# Patient Record
Sex: Female | Born: 1985 | Race: Black or African American | Hispanic: No | Marital: Single | Smoking: Current every day smoker
Health system: Southern US, Community
[De-identification: ages and names within clinical notes are randomized; demographics above are authoritative.]

## PROBLEM LIST (undated history)

## (undated) ENCOUNTER — Inpatient Hospital Stay (HOSPITAL_COMMUNITY): Payer: Self-pay

## (undated) DIAGNOSIS — F99 Mental disorder, not otherwise specified: Secondary | ICD-10-CM

## (undated) DIAGNOSIS — F32A Depression, unspecified: Secondary | ICD-10-CM

## (undated) DIAGNOSIS — F419 Anxiety disorder, unspecified: Secondary | ICD-10-CM

## (undated) DIAGNOSIS — Z3490 Encounter for supervision of normal pregnancy, unspecified, unspecified trimester: Secondary | ICD-10-CM

## (undated) DIAGNOSIS — R87629 Unspecified abnormal cytological findings in specimens from vagina: Secondary | ICD-10-CM

## (undated) DIAGNOSIS — F1910 Other psychoactive substance abuse, uncomplicated: Secondary | ICD-10-CM

## (undated) DIAGNOSIS — F329 Major depressive disorder, single episode, unspecified: Secondary | ICD-10-CM

## (undated) HISTORY — DX: Unspecified abnormal cytological findings in specimens from vagina: R87.629

## (undated) HISTORY — PX: NO PAST SURGERIES: SHX2092

---

## 2011-03-06 NOTE — L&D Delivery Note (Signed)
Delivery Note At 8:53 PM a viable female was delivered via  (Presentation: OA; LOT), compound hand.  APGAR: 9, 9; weight 4 lb 11.7 oz (2146 g).   Placenta status: Intact, Spontaneous, via Tomasa Blase.  Send to Pathology.  Cord: 3 vessels.    Anesthesia: Epidural  Episiotomy: none Lacerations: 1 st degree RT labia minora Suture Repair: 3.0 vicryl rapide Est. Blood Loss (mL): 250  Mom to postpartum.  Baby to nursery-stable.  Raelyn Mora, SNM 02/19/2012, 9:30 PM Supervised by: Dorathy Kinsman, CNM & Sid Falcon, CNM

## 2011-03-06 NOTE — L&D Delivery Note (Signed)
I was present for the delivery and agree with above. Sid Falcon, CNM present for repair.  [place], CNM 02/19/2012 10:14 PM

## 2011-08-11 ENCOUNTER — Encounter (HOSPITAL_COMMUNITY): Payer: Self-pay | Admitting: *Deleted

## 2011-08-11 ENCOUNTER — Emergency Department (HOSPITAL_COMMUNITY)
Admission: EM | Admit: 2011-08-11 | Discharge: 2011-08-11 | Disposition: A | Discharge status: Other Acute Inpatient Hospital | Payer: Self-pay | Attending: Emergency Medicine | Admitting: Emergency Medicine

## 2011-08-11 ENCOUNTER — Inpatient Hospital Stay (HOSPITAL_COMMUNITY)
Admission: RE | Admit: 2011-08-11 | Discharge: 2011-08-15 | DRG: 781 | Payer: Federal, State, Local not specified - Other | Source: Other Acute Inpatient Hospital | Attending: Psychiatry | Admitting: Psychiatry

## 2011-08-11 DIAGNOSIS — F17200 Nicotine dependence, unspecified, uncomplicated: Secondary | ICD-10-CM | POA: Insufficient documentation

## 2011-08-11 DIAGNOSIS — F1410 Cocaine abuse, uncomplicated: Secondary | ICD-10-CM | POA: Insufficient documentation

## 2011-08-11 DIAGNOSIS — F39 Unspecified mood [affective] disorder: Secondary | ICD-10-CM | POA: Diagnosis present

## 2011-08-11 DIAGNOSIS — O99340 Other mental disorders complicating pregnancy, unspecified trimester: Secondary | ICD-10-CM | POA: Diagnosis present

## 2011-08-11 DIAGNOSIS — O99891 Other specified diseases and conditions complicating pregnancy: Secondary | ICD-10-CM | POA: Insufficient documentation

## 2011-08-11 DIAGNOSIS — F1920 Other psychoactive substance dependence, uncomplicated: Principal | ICD-10-CM | POA: Diagnosis present

## 2011-08-11 DIAGNOSIS — F1010 Alcohol abuse, uncomplicated: Secondary | ICD-10-CM | POA: Insufficient documentation

## 2011-08-11 DIAGNOSIS — F1420 Cocaine dependence, uncomplicated: Secondary | ICD-10-CM | POA: Diagnosis present

## 2011-08-11 DIAGNOSIS — Z331 Pregnant state, incidental: Secondary | ICD-10-CM

## 2011-08-11 DIAGNOSIS — F1020 Alcohol dependence, uncomplicated: Secondary | ICD-10-CM | POA: Diagnosis present

## 2011-08-11 DIAGNOSIS — F1910 Other psychoactive substance abuse, uncomplicated: Secondary | ICD-10-CM

## 2011-08-11 DIAGNOSIS — O99320 Drug use complicating pregnancy, unspecified trimester: Secondary | ICD-10-CM | POA: Diagnosis present

## 2011-08-11 DIAGNOSIS — F4310 Post-traumatic stress disorder, unspecified: Secondary | ICD-10-CM | POA: Diagnosis not present

## 2011-08-11 LAB — DIFFERENTIAL
Basophils Absolute: 0 K/uL (ref 0.0–0.1)
Basophils Relative: 0 % (ref 0–1)
Eosinophils Absolute: 0.1 K/uL (ref 0.0–0.7)
Eosinophils Relative: 1 % (ref 0–5)
Lymphocytes Relative: 21 % (ref 12–46)
Lymphs Abs: 3.1 K/uL (ref 0.7–4.0)
Monocytes Absolute: 0.9 K/uL (ref 0.1–1.0)
Monocytes Relative: 6 % (ref 3–12)
Neutro Abs: 10.9 K/uL — ABNORMAL HIGH (ref 1.7–7.7)
Neutrophils Relative %: 73 % (ref 43–77)

## 2011-08-11 LAB — CBC
HCT: 37.3 % (ref 36.0–46.0)
Hemoglobin: 13.9 g/dL (ref 12.0–15.0)
MCH: 29.7 pg (ref 26.0–34.0)
MCHC: 37.3 g/dL — ABNORMAL HIGH (ref 30.0–36.0)
MCV: 79.7 fL (ref 78.0–100.0)
Platelets: 290 K/uL (ref 150–400)
RBC: 4.68 MIL/uL (ref 3.87–5.11)
RDW: 12.4 % (ref 11.5–15.5)
WBC: 15.2 K/uL — ABNORMAL HIGH (ref 4.0–10.5)

## 2011-08-11 LAB — RAPID URINE DRUG SCREEN, HOSP PERFORMED
Amphetamines: NOT DETECTED
Barbiturates: NOT DETECTED
Opiates: NOT DETECTED
Tetrahydrocannabinol: NOT DETECTED

## 2011-08-11 LAB — COMPREHENSIVE METABOLIC PANEL WITH GFR
ALT: 16 U/L (ref 0–35)
AST: 15 U/L (ref 0–37)
Albumin: 3.8 g/dL (ref 3.5–5.2)
Alkaline Phosphatase: 65 U/L (ref 39–117)
BUN: 6 mg/dL (ref 6–23)
CO2: 22 meq/L (ref 19–32)
Calcium: 9.1 mg/dL (ref 8.4–10.5)
Chloride: 100 meq/L (ref 96–112)
Creatinine, Ser: 0.74 mg/dL (ref 0.50–1.10)
GFR calc Af Amer: >90 mL/min
GFR calc non Af Amer: >90 mL/min
Glucose, Bld: 88 mg/dL (ref 70–99)
Potassium: 3.4 meq/L — ABNORMAL LOW (ref 3.5–5.1)
Sodium: 135 meq/L (ref 135–145)
Total Bilirubin: 0.5 mg/dL (ref 0.3–1.2)
Total Protein: 7.2 g/dL (ref 6.0–8.3)

## 2011-08-11 LAB — HCG, QUANTITATIVE, PREGNANCY: hCG, Beta Chain, Quant, S: 38783 m[IU]/mL — ABNORMAL HIGH

## 2011-08-11 MED ORDER — NICOTINE 21 MG/24HR TD PT24
21.0000 mg | MEDICATED_PATCH | Freq: Every day | TRANSDERMAL | Status: DC
  Filled 2011-08-11 (×3): qty 1

## 2011-08-11 NOTE — ED Provider Notes (Cosign Needed)
History   This chart was scribed for Flint Melter, MD by Shari Heritage. The patient was seen in room STRE5/STRE5. Patient's care was started at 1230.     CSN: [id number]  Arrival date & time 08/11/11  1230   First MD Initiated Contact with Patient 08/11/11 1317      Chief Complaint  Patient presents with  . Medical Clearance    (Consider location/radiation/quality/duration/timing/severity/associated sxs/prior treatment) The history is provided by the patient. No language interpreter was used.   [name] [name] is a 26 y.o. female who presents to the Emergency Department in need of detoxification from alcohol and crack. Patient has had a positive pregnancy test, but she has not seen an OB. Patient is planning on continuing the pregnancy. Patient with h/o of recurring substance abuse (alcohol and crack). Patient says that she drinks everyday (4Loko - beer with an increased caffeine content). Patient says that she uses "as much as (crack) as she can get."  Patient was recently incarcerated. Patient was released from jail on May 29 after 15 days. Patient hasn't taken prenatal vitamins since May 29.   Patient's LMS was 06/09/2010. Patient has been pregnant before. Delivered premature baby who lives with her sister in [place]. Patient denies abnormal vaginal discharge or problems urinating.  Patient is a current everyday smoker.  No past medical history on file.  No past surgical history on file.  No family history on file.  History  Substance Use Topics  . Smoking status: Current Everyday Smoker  . Smokeless tobacco: Not on file  . Alcohol Use: Yes    OB History    Grav Para Term Preterm Abortions TAB SAB Ect Mult Living   1               Review of Systems A complete 10 system review of systems was obtained and all systems are negative except as noted in the HPI and PMH.    Allergies  Review of patient's allergies indicates no known allergies.  Home Medications   Current  Outpatient Rx  Name Route Sig Dispense Refill  . PRENATAL MULTIVITAMIN CH Oral Take 1 tablet by mouth daily.      BP 129/84  Pulse 97  Temp(Src) 97.8 F (36.6 C) (Oral)  Resp 18  SpO2 99%  LMP 06/09/2011  Physical Exam  Nursing note and vitals reviewed. Constitutional: She is oriented to person, place, and time. She appears well-developed and well-nourished. No distress.       Obese.  HENT:  Head: Normocephalic and atraumatic.  Eyes: Conjunctivae and EOM are normal.  Neck: Neck supple. No tracheal deviation present.  Cardiovascular: Normal rate.   Pulmonary/Chest: Effort normal. No respiratory distress.  Abdominal: She exhibits no distension. There is no tenderness.  Genitourinary:       Uterus is no palpable.  Musculoskeletal: Normal range of motion.  Neurological: She is alert and oriented to person, place, and time. No sensory deficit.  Skin: Skin is dry.  Psychiatric: She has a normal mood and affect. Her behavior is normal.    ED Course  Procedures (including critical care time) DIAGNOSTIC STUDIES: Oxygen Saturation is 97% on room air, adequate by my interpretation.    COORDINATION OF CARE: 2:10PM - Patient informed of current plan for treatment and evaluation and agrees with plan at this time.     Labs Reviewed  PREGNANCY, URINE - Abnormal; Notable for the following:    Preg Test, Ur POSITIVE (*)    All  other components within normal limits  URINE RAPID DRUG SCREEN (HOSP PERFORMED) - Abnormal; Notable for the following:    Cocaine POSITIVE (*)    All other components within normal limits  COMPREHENSIVE METABOLIC PANEL - Abnormal; Notable for the following:    Potassium 3.4 (*)    All other components within normal limits  CBC - Abnormal; Notable for the following:    WBC 15.2 (*)    MCHC 37.3 (*)    All other components within normal limits  DIFFERENTIAL - Abnormal; Notable for the following:    Neutro Abs 10.9 (*)    All other components within normal  limits  HCG, QUANTITATIVE, PREGNANCY - Abnormal; Notable for the following:    hCG, Beta Chain, Sharene Butters, [place] [address] (*)    All other components within normal limits  ETHANOL  POCT PREGNANCY, URINE   No results found.   1. Polysubstance abuse   2. Incidental pregnancy     Seen by ACT- Admission arranged  MDM  Polysubstance abuse with uncomplicated first trimester pregnancy. Patient is motivated to change, and needs help. She'll be admitted to the behavioral health hospital. Doubt metabolic instability, serious bacterial infection or impending vascular collapse; the patient is stable for discharge.      I personally performed the services described in this documentation, which was scribed in my presence. The recorded information has been reviewed and considered.     Flint Melter, MD 08/11/11 2004

## 2011-08-11 NOTE — BHH Counselor (Signed)
Pt. Assigned to room 303-2

## 2011-08-11 NOTE — ED Notes (Signed)
Recurring substance abuse - etoh and crack.

## 2011-08-11 NOTE — Progress Notes (Signed)
Pt is a 26 year old AAF admitted to the services of Dr. Koren Shiver for detox from ETOH and crack cocaine.  Pt was just released from prison on May 29 after serving 15 days for breaking and entering.  Pt recently found out that she is pregnant and wants to get clean in order to have a healthy child.  She has an 69 year old also.  Pt is currently homeless.  She states that it is time "to clean up my life".  Pt reports drinking 6 (40 oz) beers daily.  She denied using crack during admission but did admit to it in ED.  Pt has had two prior inpt with the last being 2009.  She is pleasant and cooperative with the admission process and is not currently exhibiting signs or symptoms of withdrawal.  Jorje Guild, PA notified of her admission for orders and will research what detox medications she can take with her pregnancy and notify staff of orders at that time.  Pt does not wish to list an emergency contact at this time.

## 2011-08-11 NOTE — Tx Team (Signed)
Initial Interdisciplinary Treatment Plan  PATIENT STRENGTHS: (choose at least two) Active sense of humor Average or above average intelligence Motivation for treatment/growth  PATIENT STRESSORS: Financial difficulties Substance abuse   PROBLEM LIST: Problem List/Patient Goals Date to be addressed Date deferred Reason deferred Estimated date of resolution  ETOH dependence 08/11/11                                                      DISCHARGE CRITERIA:  Motivation to continue treatment in a less acute level of care Withdrawal symptoms are absent or subacute and managed without 24-hour nursing intervention  PRELIMINARY DISCHARGE PLAN: Attend 12-step recovery group Placement in alternative living arrangements  PATIENT/FAMIILY INVOLVEMENT: This treatment plan has been presented to and reviewed with the patient, Nia Nathaniel.  The patient and family have been given the opportunity to ask questions and make suggestions.  Juliann Pares 08/11/2011, 9:59 PM
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
## 2011-08-12 DIAGNOSIS — IMO0002 Reserved for concepts with insufficient information to code with codable children: Secondary | ICD-10-CM

## 2011-08-12 DIAGNOSIS — F4310 Post-traumatic stress disorder, unspecified: Secondary | ICD-10-CM

## 2011-08-12 DIAGNOSIS — F1994 Other psychoactive substance use, unspecified with psychoactive substance-induced mood disorder: Secondary | ICD-10-CM

## 2011-08-12 DIAGNOSIS — O99320 Drug use complicating pregnancy, unspecified trimester: Secondary | ICD-10-CM | POA: Diagnosis present

## 2011-08-12 MED ORDER — PRENATAL MULTIVITAMIN CH
1.0000 | ORAL_TABLET | Freq: Every day | ORAL | Status: DC
  Administered 2011-08-12 – 2011-08-14 (×3): 1 via ORAL
  Filled 2011-08-12 (×7): qty 1

## 2011-08-12 MED ORDER — BENZOCAINE 10 % MT GEL
Freq: Three times a day (TID) | OROMUCOSAL | Status: DC | PRN
  Administered 2011-08-12 – 2011-08-13 (×2): via OROMUCOSAL
  Filled 2011-08-12: qty 9.4

## 2011-08-12 MED ORDER — ACETAMINOPHEN 325 MG PO TABS
650.0000 mg | ORAL_TABLET | Freq: Four times a day (QID) | ORAL | Status: DC | PRN
  Administered 2011-08-12 – 2011-08-14 (×3): 650 mg via ORAL

## 2011-08-12 NOTE — Progress Notes (Signed)
Pt pleasant on approach, denies complaints other than some constipation.  Prune juice requested and given.  Pt participated in evening group, interacting appropriately within milieu.  Denies SI/HI/hallucinations at this time.  Denies s/s of withdrawal at this time.  Acquired order for prenatal vitamin and gave this to Pt tonight.  Support and encouragement offered, will continue to monitor.

## 2011-08-12 NOTE — H&P (Signed)
Psychiatric Admission Assessment Adult  Patient Identification:  [name] [name] Date of Evaluation:  08/12/2011 25yo SAAF CC: wants help to stop SA drinks and uses Cocaine  History of Present Illness: Moved to this area from [place] in February. 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 She was told she was pregnant at release and so now she wants to "clean up" Says her first son-age 66 was adopted by her sister and this time she wants to enjoy the whole experience. Has lost her ID card    Past Psychiatric History: Began using substances age 24 and also had treatment for SA since then.  Inpatient SA 2001 & 2009  Lithium caused thyroid issues. Depakote caused her to loose her hair. Has also had Zoloft Celexa and Tegretol in the past.   Substance Abuse History:  Social History:    reports that she has been smoking.  She does not have any smokeless tobacco history on file. She reports that she drinks about 25.2 ounces of alcohol per week. Her drug history not on file. UDS+Cocaine reports alcohol use but none measured and she had normal liver function  Family Psych History:  Past Medical History:     Past Medical History  Diagnosis Date  . No pertinent past medical history        Past Surgical History  Procedure Date  . No past surgeries     Allergies: No Known Allergies  Current Medications:  Prior to Admission medications   Medication Sig Start Date End Date Taking? Authorizing Provider  Prenatal Vit-Fe Fumarate-FA (PRENATAL MULTIVITAMIN) TABS Take 1 tablet by mouth daily.    Historical Provider, MD    Mental Status Examination/Evaluation: Objective:  Appearance: Fairly Groomed  Psychomotor Activity:  Normal  Eye Contact::  Good  Speech:  Normal Rate  Volume:  Normal  Mood:  Happy -glad to be pregnant  Affect:  Appropriate  Thought Process:  Clear rational goal oriented   Orientation:  Full    Thought Content:  No delusions/psychosis  Suicidal Thoughts:  No  Homicidal Thoughts:  No  Judgement:  Intact  Insight:  Present    DIAGNOSIS:    AXIS I Post Traumatic Stress Disorder, Substance Abuse and Substance Induced Mood Disorder  AXIS II Deferred  AXIS III See medical history.  AXIS IV economic problems, educational problems, housing problems, occupational problems, other psychosocial or environmental problems, problems related to legal system/crime, problems related to social environment and problems with primary support group  AXIS V 21-30 behavior considerably influenced by delusions or hallucinations OR serious impairment in judgment, communication OR inability to function in almost all areas    Treatment Plan Summary: Admit for safety& stabilization R/O STD's  Observe for alcohol withdrawl

## 2011-08-12 NOTE — Progress Notes (Signed)
Patient ID: [name] [name], female   DOB: [birth year], 26 y.o.   MRN: [id number]   [facility] Group Notes:  (Counselor/Nursing/MHT/Case Management/Adjunct)  08/12/2011 1:15 PM  Type of Therapy:  Group Therapy, Dance/Movement Therapy   Participation Level:  Active  Participation Quality:  Appropriate, Attentive and Sharing  Affect:  Appropriate  Cognitive:  Appropriate  Insight:  Good  Engagement in Group:  Good  Engagement in Therapy:  Good  Modes of Intervention:  Clarification, Problem-solving, Role-play, Socialization and Support  Summary of Progress/Problems: Therapist discussed how supports relate to letting go of addiction. Therapist asked patients to share something they enjoyed to do as a child and how they can still use some of those activities as support to aid in recovery. Therapist used a hula-hoop to symbolize support as well as addiction and modeled how each can interact in our lives. Pt. was vocal during group and interacted well among other group members. Pt. was able to process the session and appeared to enjoy the discussion.     Cassidi Long

## 2011-08-12 NOTE — Progress Notes (Signed)
Patient ID: [name] [name], female   DOB: 06/16/1985, 26 y.o.   MRN: [id number]  Pt. did not attend aftercare planning group.

## 2011-08-12 NOTE — Progress Notes (Addendum)
Patient ID: [name] [name], female   DOB: [birth year], 26 y.o.   MRN: [id number] Pt is awake and active on the unit this AM. Pt denies SI/HI and A/V hallucinations. Pt is participating in the milieu, attending groups and is cooperative with staff. Pt has insight into her addicition and is vested in treatment. Pt states that she wants to do wants best for her baby and expresses interest in its well being. Writer encouraged pt to continue with her recovery. Writer will continue to monitor. Pt is c/o constipation this PM. There is no prune juice available at the cafeteria requested from security to provide.
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
## 2011-08-12 NOTE — Progress Notes (Signed)
[facility] Adult Inpatient Family/Significant Other Suicide Prevention Education  Suicide Prevention Education:  Patient Refusal for Family/Significant Other Suicide Prevention Education: The patient [name] [name] has refused to provide written consent for family/significant other to be provided Family/Significant Other Suicide Prevention Education during admission and/or prior to discharge.  Physician notified.  Pt. accepted information on suicide prevention, warning signs to look for with suicide and crisis line numbers to use. The pt. agreed to call crisis line numbers if having warning signs or having thoughts of suicide.    [facility] 08/12/2011, 12:17 PM

## 2011-08-12 NOTE — H&P (Signed)
I have read the H&P, interviewed the patient, and I agree with the findings above.  Maximum Reiland, MD   
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
## 2011-08-13 DIAGNOSIS — F1910 Other psychoactive substance abuse, uncomplicated: Secondary | ICD-10-CM

## 2011-08-13 LAB — GC/CHLAMYDIA PROBE AMP, GENITAL
Chlamydia, DNA Probe: NEGATIVE
GC Probe Amp, Genital: NEGATIVE

## 2011-08-13 LAB — POCT PREGNANCY, URINE: Preg Test, Ur: POSITIVE — AB

## 2011-08-13 LAB — RPR: RPR Ser Ql: NONREACTIVE

## 2011-08-13 MED ORDER — DOCUSATE SODIUM 100 MG PO CAPS
100.0000 mg | ORAL_CAPSULE | Freq: Every day | ORAL | Status: DC
  Administered 2011-08-13 – 2011-08-14 (×2): 100 mg via ORAL
  Filled 2011-08-13 (×6): qty 1

## 2011-08-13 NOTE — Progress Notes (Signed)
Pt has been in bed most of the day.  She has been up for some groups but was unable/unwilling to stay until group was over.  She rated her depression and hopelessness a 7 and her anxiety level a 6 on her self-inventory.  She had c/o constipation PA aware and pt stated,"she said she was going to give me colace or something"  Informed on-coming shift that the order was not written and when md on-call is called for orders tonight, then try to see about getting the colace ordered.  She was given prn tylenol and Anbesol at 1256 for c/o toothache.  She was concerned about her baby and was wanting md to send her to [facility] to have ultra sound to make sure "the baby not in my tubes"  PA did talk with pt about having her an appointment set up when she leaves here.  She did voice understanding.

## 2011-08-13 NOTE — Progress Notes (Signed)
Patient ID: [name] [name], female   DOB: 1985/07/04, 26 y.o.   MRN: [id number]  [facility] MD Progress Note                                         08/13/2011    [name] [name] [birth year]    [id number] Hospital day #2  [id number] Abuse/Dependencies  The patient was seen today and reports the following:  Sleep: "not too good." Appetite: Ok  Mild>(1-10) >Severe  Hopelessness (1-10): 0 Depression (1-10): 6/10 Anxiety (1-10): 8/10 Suicidal Ideation: Denies suicidal ideation Plan: None Intent: None Means:  None Homicidal Ideation: Denies homicidal ideation Plan: None Intent: None Means: None  Eye Contact: Good.  General Appearance/Behavior: casual dressed Motor Behavior: normal Speech: clear  Mental Status: oriented x 3 Level of Consciousness:  alert Mood: anxious Affect: congruent  Thought Process:  linear Thought Content: no AH/VH Perception: intact  Judgment: fair Insight: fair Cognition: at least average  Sleep: Number of Hours:   Filed Vitals:   08/13/11 1146  BP: 130/80  Pulse: 81  Temp:   Resp:        . prenatal multivitamin  1 tablet Oral Daily    Results for orders placed during the hospital encounter of 08/11/11 (from the past 48 hour(s))  GC/CHLAMYDIA PROBE AMP, GENITAL     Status: Normal   Collection Time   08/12/11  7:00 PM      Component Value Range Comment   GC Probe Amp, Genital NEGATIVE  NEGATIVE     Chlamydia, DNA Probe NEGATIVE  NEGATIVE    RPR     Status: Normal   Collection Time   08/12/11  7:45 PM      Component Value Range Comment   RPR NON REACTIVE  NON REACTIVE      No results found for this or any previous visit (from the past 48 hour(s)).  ROS:    Constitutional: WDWN AAF NAD   GI: Negative for N,V,D,C   Neuro: Negative for dizziness, blurred vision, visual changes, headaches   Resp: Negative for wheezing, SOB, cough   Cardio: Negative for CP, diaphoresis, fatigue   MSK: Negative for joint pain, swelling, DROM, or  ambulatory difficulties.  Time was spent with the patient today discussing her current symptoms and her treatment.  She is requesting an ultra sound to make sure that she is not pregnant in her tubes.  The patient is educated that it is too early for this type of evaluation and currently not warranted due to her lack of symptoms.  She also notes some constipation and states she had a BM 2 days ago.  She denies any withdrawal symptoms and states she wants to carry the pregnancy to term, but also wants to make sure that the baby is ok.  She is reassured that she will be set up with an appointment at the [facility] upon discharge and she will need to follow up there.  [name] [name] will let us know if she has any further concerns or problems.  1.Will continue with supportive care and monitor for complications of withdrawal. 2. Colace for constipation. 3. Monitor for signs and symptoms of withdrawal that may need treatment. 4. Continue to monitor.  Rona Ravens. Corinne Goucher [facility] 08/13/2011

## 2011-08-13 NOTE — Progress Notes (Signed)
BHH Group Notes:  (Counselor/Nursing/MHT/Case Management/Adjunct)  08/13/2011 3:40 PM  Type of Therapy:  Group Therapy  Participation Level:  Active  Participation Quality:  Monopolizing and Sharing  Affect:  Anxious  Cognitive:  Alert and Oriented  Insight:  Good  Engagement in Group:  Limited  Engagement in Therapy:  Limited  Modes of Intervention:  Clarification, Problem-solving, Socialization and Support  Summary of Progress/Problems:  [name] [name] was called out by MHT to presumably see physician.   Group discussion focused on what patient's see as their own obstacles to recovery.  Patient shares belief that boredom and free time will be difficult to deal with due to "too much time to think, gimme some time to think and I'm coming up with a problem or boredom, eith of which is responsive to using."   [name] [name] 08/13/2011, 3:40 PM  BHH Group Notes:  (Counselor/Nursing/MHT/Case Management/Adjunct)  08/13/2011 3:46 PM  Type of Therapy:  Group Therapy  Participation Level:  Did Not Attend   [name] [name] 08/13/2011, 3:46 PM

## 2011-08-13 NOTE — Discharge Planning (Signed)
[name] [name] was reluctant to come to group because of GI discomfort, but agreed to join [name] [name].  Pregnant.  Wants to get into rehab.  Asked about [facility].  Was told they do not work with pregnant women.  Then asked for referral to [facility].  Got her screening for Wed AM.

## 2011-08-14 MED ORDER — PRENATAL MULTIVITAMIN CH: 1.0000 | ORAL_TABLET | Freq: Every day | ORAL | Status: DC

## 2011-08-14 NOTE — Treatment Plan (Signed)
Interdisciplinary Treatment Plan Update (Adult)  Date: 08/14/2011  Time Reviewed: 12:05 PM   Progress in Treatment: Attending groups: Yes Participating in groups: Yes Taking medication as prescribed: Yes Tolerating medication: Yes   Family/Significant other contact made:  No Patient understands diagnosis:  Yes As evidenced by asking for help getting into rehab from here Discussing patient identified problems/goals with staff:  Yes  See below Medical problems stabilized or resolved:  Yes Denies suicidal/homicidal ideation: Yes  In tx team Issues/concerns per patient self-inventory:  Yes  Depression 7, hopelessness 5, agitation, poor sleep Other:  New problem(s) identified: N/A  Reason for Continuation of Hospitalization: Depression  Interventions implemented related to continuation of hospitalization: encourage group attendance and participation  Additional comments:  Estimated length of stay: 1 day  Discharge Plan: Transfer to [facility] rehab  New goal(s): N/A  Review of initial/current patient goals per problem list:   1.  Goal(s): Get into rehab from here  Met:  Yes  Target date:6/11  As evidenced [id number] has a screening for admission at [facility] tomorrow, 6/12  2.  Goal (s): Decrease depression  Met:  No  Target date:6/12  As evidenced [name] will rate her depression at a 4 or less  3.  Goal(s):  Met:  Yes  Target date:  As evidenced by:  4.  Goal(s):  Met:  Yes  Target date:  As evidenced by:  Attendees: Patient: [name] [name] 08/14/2011 12:05 PM  Family:     Physician:  Lupe Carney 08/14/2011 12:05 PM   Nursing:  Roswell Miners  08/14/2011 12:05 PM   Case Manager:  Richelle Ito, LCSW 08/14/2011 12:05 PM   Counselor:  Ronda Fairly, LCSWA 08/14/2011 12:05 PM   Other:     Other:     Other:     Other:      Scribe for Treatment Team:   Ida Rogue, 08/14/2011 12:05 PM

## 2011-08-14 NOTE — Progress Notes (Signed)
B[facility] Group Notes:  (Counselor/Nursing/MHT/Case Management/Adjunct)  08/14/2011 3:19 PM  Type of Therapy:  Group Therapy at 11 AM  Participation Level:  Active  Participation Quality:  Appropriate, Attentive, Inattentive, Sharing and Supportive  Affect:  Appropriate   Cognitive:  Appropriate  Insight:  Good  Engagement in Group:  Good  Engagement in Therapy:  Good  Modes of Intervention:  Clarification, Limit-setting and Support  Summary of Progress/Problems: Focus of group discussion today was feelings related to one's diagnosis of substance abuse, after completing a self test and discussing ASAM definition of additiction.   Ambermarie was very helppful as she read questions to group of three men whom either did not have glasses or had difficulty reading.  Cherica shared how addiction can control her life is she is "not doing the things we do to help me stay clean."  Clide Dales 08/14/2011, 3:19 PM

## 2011-08-14 NOTE — Progress Notes (Signed)
Patient ID: [name] [name], female   DOB: 10-Apr-1985, 26 y.o.   MRN: [id number] Pt. Reports no BM x 3 Days. Writer received order for Colace(see MAR). Writer encouraged fluid to help facilitate bowel movement. Pt. Denies SHI. Staff will monitor q2min for safety.

## 2011-08-14 NOTE — Progress Notes (Signed)
[facility] Case Management Discharge Plan:  Will you be returning to the same living situation after discharge: No. At discharge, do you have transportation home?:Yes,  another pt will provide ride Do you have the ability to pay for your medications:Yes,  no meds  Interagency Information:     Release of information consent forms completed and in the chart;  Patient's signature needed at discharge.  Patient to Follow up at:  Follow-up Information    Follow up with [facility] on 08/15/2011. (8:00 sharp with Roe Coombs)    Contact information:   [address] [place]  [phone]  [phone]         Patient denies SI/HI:   Yes,  yes    Aeronautical engineer and Suicide Prevention discussed:  Yes,  yes  Barrier to discharge identified:No.  Summary and Recommendations:   [name] [name] 08/14/2011, 12:17 PM

## 2011-08-14 NOTE — Progress Notes (Signed)
Patient ID: [name] [name], female   DOB: [birth year], 26 y.o.   MRN: [id number] She has  Up and about and to groups , interacting with peers and staff. Laxative has not been effective . Continues to be depressed and hopeless at 7 and 5.
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
## 2011-08-15 NOTE — Progress Notes (Signed)
Patient pleasant and cooperative upon my approach. Patient denies SI/HI, denies A/V hallucinations. Patient denies symptoms of withdrawal. Patient verbalizes understanding of discharge instructions, medications and follow up care. Patient verbalizes understanding of plan of care. All patient belongings from [facility] locker #34 (one black scarf, pair of shoes) returned to patient. Patient escorted out by staff, transported by friend.

## 2011-08-15 NOTE — Progress Notes (Signed)
Patient pleasant and cooperative upon my assessment. Patient participating in groups, interacting with peers in dayroom. Patient denies SI/HI, denies A/V hallucinations. Patient denies symptoms of withdrawal. Patient states she is "having an okay day." Patient given support and encouragement. Patient remains safe on unit with Q15 minute checks for safety. Will continue to monitor.

## 2011-08-17 NOTE — Progress Notes (Signed)
Patient Discharge Instructions:  After Visit Summary (AVS):   Faxed to:  08/16/2012 Psychiatric Admission Assessment Note:   Faxed to:  08/16/2012 Suicide Risk Assessment - Discharge Assessment:   Faxed to:  08/16/2012 Faxed/Sent to the Next Level Care provider:  08/16/2012  Information faxed to: [facility] to [facility] @ fax #: [phone]  Karleen Hampshire Brittini, 08/17/2011, 4:40 PM

## 2011-08-23 ENCOUNTER — Emergency Department (HOSPITAL_BASED_OUTPATIENT_CLINIC_OR_DEPARTMENT_OTHER): Payer: Self-pay

## 2011-08-23 ENCOUNTER — Emergency Department (HOSPITAL_BASED_OUTPATIENT_CLINIC_OR_DEPARTMENT_OTHER)
Admission: EM | Admit: 2011-08-23 | Discharge: 2011-08-23 | Disposition: A | Discharge status: Home / Self Care | Payer: Self-pay | Attending: Emergency Medicine | Admitting: Emergency Medicine

## 2011-08-23 ENCOUNTER — Encounter (HOSPITAL_BASED_OUTPATIENT_CLINIC_OR_DEPARTMENT_OTHER): Payer: Self-pay | Admitting: Family Medicine

## 2011-08-23 DIAGNOSIS — N898 Other specified noninflammatory disorders of vagina: Secondary | ICD-10-CM | POA: Insufficient documentation

## 2011-08-23 DIAGNOSIS — B9689 Other specified bacterial agents as the cause of diseases classified elsewhere: Secondary | ICD-10-CM

## 2011-08-23 DIAGNOSIS — A599 Trichomoniasis, unspecified: Secondary | ICD-10-CM

## 2011-08-23 DIAGNOSIS — O99891 Other specified diseases and conditions complicating pregnancy: Secondary | ICD-10-CM | POA: Insufficient documentation

## 2011-08-23 DIAGNOSIS — Z3490 Encounter for supervision of normal pregnancy, unspecified, unspecified trimester: Secondary | ICD-10-CM

## 2011-08-23 DIAGNOSIS — O99330 Smoking (tobacco) complicating pregnancy, unspecified trimester: Secondary | ICD-10-CM | POA: Insufficient documentation

## 2011-08-23 LAB — URINALYSIS, ROUTINE W REFLEX MICROSCOPIC
Glucose, UA: NEGATIVE mg/dL
Hgb urine dipstick: NEGATIVE
Specific Gravity, Urine: 1.044 — ABNORMAL HIGH (ref 1.005–1.030)
Urobilinogen, UA: 1 mg/dL (ref 0.0–1.0)

## 2011-08-23 LAB — URINE MICROSCOPIC-ADD ON

## 2011-08-23 LAB — WET PREP, GENITAL: Yeast Wet Prep HPF POC: NONE SEEN

## 2011-08-23 MED ORDER — METRONIDAZOLE 500 MG PO TABS: 500.0000 mg | ORAL_TABLET | Freq: Two times a day (BID) | ORAL | Status: AC

## 2011-08-23 NOTE — ED Provider Notes (Signed)
History     CSN: [id number]  Arrival date & time 08/23/11  1029   First MD Initiated Contact with Patient 08/23/11 1135      Chief Complaint  Patient presents with  . Vaginal Discharge  . Abdominal Pain  . Possible Pregnancy    (Consider location/radiation/quality/duration/timing/severity/associated sxs/prior treatment) HPI Comments: Patient reports having vaginal discharge and lower abd pain for the past two days.  She was seen at [facility] last week and found out she was pregnant.  She had been using crack and alcohol and is now in treatment for this at [facility].    Patient is a 26 y.o. female presenting with vaginal discharge. The history is provided by the patient.  Vaginal Discharge This is a new problem. The current episode started 2 days ago. The problem occurs constantly. The problem has been gradually worsening. Nothing aggravates the symptoms. Nothing relieves the symptoms. She has tried nothing for the symptoms.    Past Medical History  Diagnosis Date  . No pertinent past medical history     Past Surgical History  Procedure Date  . No past surgeries     No family history on file.  History  Substance Use Topics  . Smoking status: Current Everyday Smoker -- 1.0 packs/day  . Smokeless tobacco: Not on file  . Alcohol Use: 25.2 oz/week    42 Cans of beer per week    OB History    Grav Para Term Preterm Abortions TAB SAB Ect Mult Living   1               Review of Systems  All other systems reviewed and are negative.    Allergies  Review of patient's allergies indicates no known allergies.  Home Medications   Current Outpatient Rx  Name Route Sig Dispense Refill  . PRENATAL MULTIVITAMIN CH Oral Take 1 tablet by mouth daily. For pregnancy health 30 tablet 3    BP 122/69  Pulse 82  Temp 98.7 F (37.1 C) (Oral)  Resp 20  Ht 5\' 8"  (1.727 m)  Wt 215 lb (97.523 kg)  BMI 32.69 kg/m2  SpO2 100%  LMP 06/09/2011  Physical Exam  Nursing note and vitals  reviewed. Constitutional: She is oriented to person, place, and time. She appears well-developed and well-nourished. No distress.  HENT:  Head: Normocephalic and atraumatic.  Neck: Normal range of motion. Neck supple.  Cardiovascular: Normal rate and regular rhythm.  Exam reveals no gallop and no friction rub.   No murmur heard. Pulmonary/Chest: Effort normal and breath sounds normal. No respiratory distress. She has no wheezes.  Abdominal: Soft. Bowel sounds are normal. She exhibits no distension. There is no tenderness.  Genitourinary: Uterus normal. Vaginal discharge found.       Yellowish vaginal discharge present.  Musculoskeletal: Normal range of motion.  Neurological: She is alert and oriented to person, place, and time.  Skin: Skin is warm and dry. She is not diaphoretic.    ED Course  Procedures (including critical care time)   Labs Reviewed  WET PREP, GENITAL  GC/CHLAMYDIA PROBE AMP, GENITAL   No results found.   No diagnosis found.    MDM  The ultrasound shows an iup at about 9 1/[redacted] weeks gestation.  The wet prep shows many wbc, few clues and trich.  Will treat with flagyl pending gc and chlamydia testing.  She tells me that a vaginal exam was not performed at [facility] when she was there last week, however there  is a negative gc/chlamydia test on file from there.  I will hold off on treating for this until the cultures are done.          Geoffery Lyons, MD 08/23/11 1258

## 2011-08-23 NOTE — Discharge Instructions (Signed)
Bacterial Vaginosis Bacterial vaginosis (BV) is a vaginal infection where the normal balance of bacteria in the vagina is disrupted. The normal balance is then replaced by an overgrowth of certain bacteria. There are several different kinds of bacteria that can cause BV. BV is the most common vaginal infection in women of childbearing age. CAUSES   The cause of BV is not fully understood. BV develops when there is an increase or imbalance of harmful bacteria.   Some activities or behaviors can upset the normal balance of bacteria in the vagina and put women at increased risk including:   Having a new sex partner or multiple sex partners.   Douching.   Using an intrauterine device (IUD) for contraception.   It is not clear what role sexual activity plays in the development of BV. However, women that have never had sexual intercourse are rarely infected with BV.  Women do not get BV from toilet seats, bedding, swimming pools or from touching objects around them.  SYMPTOMS   Grey vaginal discharge.   A fish-like odor with discharge, especially after sexual intercourse.   Itching or burning of the vagina and vulva.   Burning or pain with urination.   Some women have no signs or symptoms at all.  DIAGNOSIS  Your caregiver must examine the vagina for signs of BV. Your caregiver will perform lab tests and look at the sample of vaginal fluid through a microscope. They will look for bacteria and abnormal cells (clue cells), a pH test higher than 4.5, and a positive amine test all associated with BV.  RISKS AND COMPLICATIONS   Pelvic inflammatory disease (PID).   Infections following gynecology surgery.   Developing HIV.   Developing herpes virus.  TREATMENT  Sometimes BV will clear up without treatment. However, all women with symptoms of BV should be treated to avoid complications, especially if gynecology surgery is planned. Female partners generally do not need to be treated. However,  BV may spread between female sex partners so treatment is helpful in preventing a recurrence of BV.   BV may be treated with antibiotics. The antibiotics come in either pill or vaginal cream forms. Either can be used with nonpregnant or pregnant women, but the recommended dosages differ. These antibiotics are not harmful to the baby.   BV can recur after treatment. If this happens, a second round of antibiotics will often be prescribed.   Treatment is important for pregnant women. If not treated, BV can cause a premature delivery, especially for a pregnant woman who had a premature birth in the past. All pregnant women who have symptoms of BV should be checked and treated.   For chronic reoccurrence of BV, treatment with a type of prescribed gel vaginally twice a week is helpful.  HOME CARE INSTRUCTIONS   Finish all medication as directed by your caregiver.   Do not have sex until treatment is completed.   Tell your sexual partner that you have a vaginal infection. They should see their caregiver and be treated if they have problems, such as a mild rash or itching.   Practice safe sex. Use condoms. Only have 1 sex partner.  PREVENTION  Basic prevention steps can help reduce the risk of upsetting the natural balance of bacteria in the vagina and developing BV:  Do not have sexual intercourse (be abstinent).   Do not douche.   Use all of the medicine prescribed for treatment of BV, even if the signs and symptoms go away.     Tell your sex partner if you have BV. That way, they can be treated, if needed, to prevent reoccurrence.  SEEK MEDICAL CARE IF:   Your symptoms are not improving after 3 days of treatment.   You have increased discharge, pain, or fever.  MAKE SURE YOU:   Understand these instructions.   Will watch your condition.   Will get help right away if you are not doing well or get worse.  FOR MORE INFORMATION  Division of STD Prevention (DSTDP), Centers for Disease  Control and Prevention: www.cdc.gov/std American Social Health Association (ASHA): www.ashastd.org  Document Released: 02/19/2005 Document Revised: 02/08/2011 Document Reviewed: 08/12/2008 ExitCare Patient Information 2012 [employer]. 

## 2011-08-23 NOTE — ED Notes (Signed)
Pt sts she is [redacted] wks pregnant and having vaginal discharge x 2 wks and lower abdomen pain x 2 days.

## 2011-08-23 NOTE — ED Notes (Signed)
Pt is a resident at [facility]. The phone # is [phone]

## 2011-08-31 ENCOUNTER — Emergency Department (HOSPITAL_BASED_OUTPATIENT_CLINIC_OR_DEPARTMENT_OTHER)
Admission: EM | Admit: 2011-08-31 | Discharge: 2011-08-31 | Disposition: A | Discharge status: Home / Self Care | Payer: Self-pay | Attending: Emergency Medicine | Admitting: Emergency Medicine

## 2011-08-31 ENCOUNTER — Encounter (HOSPITAL_BASED_OUTPATIENT_CLINIC_OR_DEPARTMENT_OTHER): Payer: Self-pay | Admitting: Family Medicine

## 2011-08-31 DIAGNOSIS — O26899 Other specified pregnancy related conditions, unspecified trimester: Secondary | ICD-10-CM

## 2011-08-31 DIAGNOSIS — N949 Unspecified condition associated with female genital organs and menstrual cycle: Secondary | ICD-10-CM | POA: Insufficient documentation

## 2011-08-31 DIAGNOSIS — O99330 Smoking (tobacco) complicating pregnancy, unspecified trimester: Secondary | ICD-10-CM | POA: Insufficient documentation

## 2011-08-31 DIAGNOSIS — O99891 Other specified diseases and conditions complicating pregnancy: Secondary | ICD-10-CM | POA: Insufficient documentation

## 2011-08-31 LAB — URINALYSIS, ROUTINE W REFLEX MICROSCOPIC
Hgb urine dipstick: NEGATIVE
Protein, ur: NEGATIVE mg/dL
Urobilinogen, UA: 1 mg/dL (ref 0.0–1.0)

## 2011-08-31 LAB — URINE MICROSCOPIC-ADD ON

## 2011-08-31 NOTE — Discharge Instructions (Signed)
Abdominal Pain, Women Abdominal (stomach, pelvic, or belly) pain can be caused by many things. It is important to tell your doctor:  The location of the pain.   Does it come and go or is it present all the time?   Are there things that start the pain (eating certain foods, exercise)?   Are there other symptoms associated with the pain (fever, nausea, vomiting, diarrhea)?  All of this is helpful to know when trying to find the cause of the pain. CAUSES   Stomach: virus or bacteria infection, or ulcer.   Intestine: appendicitis (inflamed appendix), regional ileitis (Crohn's disease), ulcerative colitis (inflamed colon), irritable bowel syndrome, diverticulitis (inflamed diverticulum of the colon), or cancer of the stomach or intestine.   Gallbladder disease or stones in the gallbladder.   Kidney disease, kidney stones, or infection.   Pancreas infection or cancer.   Fibromyalgia (pain disorder).   Diseases of the female organs:   Uterus: fibroid (non-cancerous) tumors or infection.   Fallopian tubes: infection or tubal pregnancy.   Ovary: cysts or tumors.   Pelvic adhesions (scar tissue).   Endometriosis (uterus lining tissue growing in the pelvis and on the pelvic organs).   Pelvic congestion syndrome (female organs filling up with blood just before the menstrual period).   Pain with the menstrual period.   Pain with ovulation (producing an egg).   Pain with an IUD (intrauterine device, birth control) in the uterus.   Cancer of the female organs.   Functional pain (pain not caused by a disease, may improve without treatment).   Psychological pain.   Depression.  DIAGNOSIS  Your doctor will decide the seriousness of your pain by doing an examination.  Blood tests.   X-rays.   Ultrasound.   CT scan (computed tomography, special type of X-ray).   MRI (magnetic resonance imaging).   Cultures, for infection.   Barium enema (dye inserted in the large  intestine, to better view it with X-rays).   Colonoscopy (looking in intestine with a lighted tube).   Laparoscopy (minor surgery, looking in abdomen with a lighted tube).   Major abdominal exploratory surgery (looking in abdomen with a large incision).  TREATMENT  The treatment will depend on the cause of the pain.   Many cases can be observed and treated at home.   Over-the-counter medicines recommended by your caregiver.   Prescription medicine.   Antibiotics, for infection.   Birth control pills, for painful periods or for ovulation pain.   Hormone treatment, for endometriosis.   Nerve blocking injections.   Physical therapy.   Antidepressants.   Counseling with a psychologist or psychiatrist.   Minor or major surgery.  HOME CARE INSTRUCTIONS   Do not take laxatives, unless directed by your caregiver.   Take over-the-counter pain medicine only if ordered by your caregiver. Do not take aspirin because it can cause an upset stomach or bleeding.   Try a clear liquid diet (broth or water) as ordered by your caregiver. Slowly move to a bland diet, as tolerated, if the pain is related to the stomach or intestine.   Have a thermometer and take your temperature several times a day, and record it.   Bed rest and sleep, if it helps the pain.   Avoid sexual intercourse, if it causes pain.   Avoid stressful situations.   Keep your follow-up appointments and tests, as your caregiver orders.   If the pain does not go away with medicine or surgery, you may   try:   Acupuncture.   Relaxation exercises (yoga, meditation).   Group therapy.   Counseling.  SEEK MEDICAL CARE IF:   You notice certain foods cause stomach pain.   Your home care treatment is not helping your pain.   You need stronger pain medicine.   You want your IUD removed.   You feel faint or lightheaded.   You develop nausea and vomiting.   You develop a rash.   You are having side effects or  an allergy to your medicine.  SEEK IMMEDIATE MEDICAL CARE IF:   Your pain does not go away or gets worse.   You have a fever.   Your pain is felt only in portions of the abdomen. The right side could possibly be appendicitis. The left lower portion of the abdomen could be colitis or diverticulitis.   You are passing blood in your stools (bright red or black tarry stools, with or without vomiting).   You have blood in your urine.   You develop chills, with or without a fever.   You pass out.  MAKE SURE YOU:   Understand these instructions.   Will watch your condition.   Will get help right away if you are not doing well or get worse.  Document Released: 12/17/2006 Document Revised: 02/08/2011 Document Reviewed: 01/06/2009 ExitCare Patient Information 2012 [employer]. 

## 2011-08-31 NOTE — ED Provider Notes (Signed)
History     CSN: [id number]  Arrival date & time 08/31/11  7829   First MD Initiated Contact with Patient 08/31/11 [phone]      Chief Complaint  Patient presents with  . Abdominal Pain    (Consider location/radiation/quality/duration/timing/severity/associated sxs/prior treatment) HPI Comments: Asian states, that she's approximately [redacted] weeks pregnant. She was seen here one week, ago for vaginal discharge and possible pregnancy. She had a pelvic ultrasound done at that point, which showed intrauterine gestation at 9 weeks 4 days. She was also treated for bacterial vaginosis with Flagyl. She states that she just took her last dose of Flagyl in her vaginal discharge is much improved. She denies any bleeding. She comes in today complaining of sharp, crampy pains in her lower abdomen. It started this morning. She denies any pain currently. She denies any nausea or vomiting. She does complain of some constipation, which has been going on for the last couple weeks. However, she did have a bowel movement. This morning. Denies any fevers. Denies any UTI symptoms. Denies any current vaginal bleeding or discharge.  Patient is a 26 y.o. female presenting with abdominal pain. The history is provided by the patient.  Abdominal Pain The primary symptoms of the illness include abdominal pain. The primary symptoms of the illness do not include fever, fatigue, shortness of breath, nausea, vomiting, diarrhea, vaginal discharge or vaginal bleeding.  Symptoms associated with the illness do not include chills, diaphoresis, hematuria, frequency or back pain.    Past Medical History  Diagnosis Date  . No pertinent past medical history     Past Surgical History  Procedure Date  . No past surgeries     No family history on file.  History  Substance Use Topics  . Smoking status: Current Everyday Smoker -- 1.0 packs/day  . Smokeless tobacco: Not on file  . Alcohol Use: 25.2 oz/week    42 Cans of beer per  week    OB History    Grav Para Term Preterm Abortions TAB SAB Ect Mult Living   1               Review of Systems  Constitutional: Negative for fever, chills, diaphoresis and fatigue.  HENT: Negative for congestion, rhinorrhea and sneezing.   Eyes: Negative.   Respiratory: Negative for cough, chest tightness and shortness of breath.   Cardiovascular: Negative for chest pain and leg swelling.  Gastrointestinal: Positive for abdominal pain. Negative for nausea, vomiting, diarrhea and blood in stool.  Genitourinary: Negative for frequency, hematuria, flank pain, vaginal bleeding, vaginal discharge and difficulty urinating.  Musculoskeletal: Negative for back pain and arthralgias.  Skin: Negative for rash.  Neurological: Negative for dizziness, speech difficulty, weakness, numbness and headaches.    Allergies  Review of patient's allergies indicates no known allergies.  Home Medications   Current Outpatient Rx  Name Route Sig Dispense Refill  . METRONIDAZOLE 500 MG PO TABS Oral Take 1 tablet (500 mg total) by mouth 2 (two) times daily. One po bid x 7 days 14 tablet 0  . PRENATAL MULTIVITAMIN CH Oral Take 1 tablet by mouth daily. For pregnancy health 30 tablet 3    BP 122/65  Pulse 86  Temp 97.6 F (36.4 C) (Oral)  SpO2 100%  LMP 06/09/2011  Physical Exam  Constitutional: She is oriented to person, place, and time. She appears well-developed and well-nourished.  HENT:  Head: Normocephalic and atraumatic.  Eyes: Pupils are equal, round, and reactive to light.  Neck:  Normal range of motion. Neck supple.  Cardiovascular: Normal rate, regular rhythm and normal heart sounds.   Pulmonary/Chest: Effort normal and breath sounds normal. No respiratory distress. She has no wheezes. She has no rales. She exhibits no tenderness.  Abdominal: Soft. Bowel sounds are normal. There is no tenderness. There is no rebound and no guarding.  Musculoskeletal: Normal range of motion. She  exhibits no edema.  Lymphadenopathy:    She has no cervical adenopathy.  Neurological: She is alert and oriented to person, place, and time.  Skin: Skin is warm and dry. No rash noted.  Psychiatric: She has a normal mood and affect.    ED Course  Procedures (including critical care time)  Results for orders placed during the hospital encounter of 08/31/11  URINALYSIS, ROUTINE W REFLEX MICROSCOPIC      Component Value Range   Color, Urine AMBER (*) YELLOW   APPearance CLOUDY (*) CLEAR   Specific Gravity, Urine 1.026  1.005 - 1.030   pH 6.5  5.0 - 8.0   Glucose, UA NEGATIVE  NEGATIVE mg/dL   Hgb urine dipstick NEGATIVE  NEGATIVE   Bilirubin Urine NEGATIVE  NEGATIVE   Ketones, ur NEGATIVE  NEGATIVE mg/dL   Protein, ur NEGATIVE  NEGATIVE mg/dL   Urobilinogen, UA 1.0  0.0 - 1.0 mg/dL   Nitrite NEGATIVE  NEGATIVE   Leukocytes, UA MODERATE (*) NEGATIVE  URINE MICROSCOPIC-ADD ON      Component Value Range   Squamous Epithelial / LPF MANY (*) RARE   WBC, UA 3-6  <3 WBC/hpf   Bacteria, UA MANY (*) RARE   US Ob Comp Less 14 Wks  08/23/2011  *RADIOLOGY REPORT*  Clinical Data: VAGINAL DISCHARGE ABDOMINAL PAIN POSSIBLE PREGNANCY,pain; ;  OBSTETRIC <14 WK [place] AND TRANSVAGINAL OB US  Technique: Both transabdominal and transvaginal ultrasound examinations were performed for complete evaluation of the gestation as well as the maternal uterus, adnexal regions, and pelvic cul-de-sac.  Comparison: None.  Findings: There is a single intrauterine gestation, 9 weeks 4 days by crown-rump length.  Fetal heart rate 168 beats per minute.  No subchorionic hemorrhage.  Left corpus luteal cyst.  No adnexal masses.  Trace free fluid in the pelvis.  IMPRESSION: 9-week-4-day intrauterine pregnancy.  Fetal heart rate 168 beats per minute.  Original Report Authenticated By: Cyndie Chime, M.D.   US Ob Transvaginal  08/23/2011  *RADIOLOGY REPORT*  Clinical Data: VAGINAL DISCHARGE ABDOMINAL PAIN POSSIBLE  PREGNANCY,pain; ;  OBSTETRIC <14 WK [place] AND TRANSVAGINAL OB US  Technique: Both transabdominal and transvaginal ultrasound examinations were performed for complete evaluation of the gestation as well as the maternal uterus, adnexal regions, and pelvic cul-de-sac.  Comparison: None.  Findings: There is a single intrauterine gestation, 9 weeks 4 days by crown-rump length.  Fetal heart rate 168 beats per minute.  No subchorionic hemorrhage.  Left corpus luteal cyst.  No adnexal masses.  Trace free fluid in the pelvis.  IMPRESSION: 9-week-4-day intrauterine pregnancy.  Fetal heart rate 168 beats per minute.  Original Report Authenticated By: Cyndie Chime, M.D.    No results found.   1. Pelvic pain complicating pregnancy       MDM  I did do a bedside ultrasound, which showed an intrauterine pregnancy, with good heartbeat. Patient is completely nontender on exam. She has an appointment to followup with the health department regarding her pregnancy. On July 7. I advised her to increase her fluid intake, take Tylenol as needed. For pain, and return  for any worsening pain or vaginal bleeding. At this point. I don't feel that any further workup is needed. I feel this is likely some ligament type pain. She has a confirmed intrauterine pregnancy. The ultrasound last week. She does have a history of cocaine and alcohol use which is currently getting treatment for, but the pregnancy, seems viable at this point.  Urine appears to be a dirty specimen, and patient denies any UTI symptoms        Rolan Bucco, MD 08/31/11 [phone]

## 2011-08-31 NOTE — ED Notes (Signed)
Pt is currently in [facility] rehab facility. Pt sts she is [redacted]wks pregnant and was "treated for bacterial vaginosis", finishing last flagyl tab this morning. Pt sts she is having "little sharp pains" in suprapubic region. Pt sts she is also feeling constipated.

## 2011-09-05 NOTE — Discharge Summary (Signed)
Physician Discharge Summary Note  Patient:  [name] [name] is an 26 y.o., female MRN:  [id number] DOB:  1985/08/28 Patient phone:  [phone] (home)  Patient address:   [address] [place] [address],   Date of Admission:  08/11/2011 Date of Discharge: 08/13/2011  Reason for Admission: detox  Discharge Diagnoses: Active Problems:  Substance abuse complicating pregnancy, antepartum  PTSD (post-traumatic stress disorder)   Axis Diagnosis:  Discharge Diagnoses:  AXIS I: Cocaine & Alcohol Dependence; Mood Disorder NOS  AXIS II: Deferred  AXIS III: IUP  Past Medical History   Diagnosis  Date   .  No pertinent past medical history     AXIS IV: Moderate  AXIS V: 50 Level of Care:  Residential Care  Hospital Course:  Pama was admitted for detox and stabilization.  She was treated with as few medications as possible.  She was admitted and treated with standard of care including the Librium a protocol.  She was evaluated daily by clinical provider. Her response to treatment was monitored by daily self inventories completed by the patient to evaluate mood and mental status.  She was on prenatal vitamins and declined other medications. Mikeisha had a good response to treatment and wanted to continue the pregnancy.  Arrangements were made for her to follow up with the [facility] hospital outpatient clinic.  On the day of discharge she was in full touch with the reality.  She denies [facility], denies SI/HI.  Anishka was optimistic and encouraged for the future.  She was evaluated by the treatment team and felt ready for discharge.     Consults: none  Significant Diagnostic Studies:  labs  Discharge Vitals:   Blood pressure 114/78, pulse 107, temperature 98 F (36.7 C), temperature source Oral, resp. rate 18, height 5' 7.5" (1.715 m), weight 93.895 kg (207 lb), last menstrual period 06/09/2011.  Mental Status Exam: See Mental Status Examination and Suicide Risk Assessment completed by  Attending Physician prior to discharge.  Discharge destination:  home  Is patient on multiple antipsychotic therapies at discharge:  No   Has Patient had three or more failed trials of antipsychotic monotherapy by history:  No Recommended Plan for Multiple Antipsychotic Therapies: not applicable Medication List  As of 09/05/2011  9:31 PM   TAKE these medications      Indication    prenatal multivitamin Tabs   Take 1 tablet by mouth daily. For pregnancy health            Follow-up Information    Follow up with [facility] on 08/15/2011. (8:00 sharp with Roe Coombs)    Contact information:   [address] [place]  [phone]  [phone]         Follow-up recommendations:  Eat a heart healthy diet.  Exercise daily as tolerated. Get plenty of rest.    Comments:    Signed: Lloyd Huger T. Jenson Beedle PAC For Dr. Lupe Carney 09/05/2011, 9:31 PM

## 2011-09-10 ENCOUNTER — Encounter (HOSPITAL_COMMUNITY): Payer: Self-pay | Admitting: Emergency Medicine

## 2011-09-10 ENCOUNTER — Emergency Department (HOSPITAL_COMMUNITY)
Admission: EM | Admit: 2011-09-10 | Discharge: 2011-09-13 | Disposition: A | Discharge status: Home / Self Care | Payer: Self-pay | Attending: Emergency Medicine | Admitting: Emergency Medicine

## 2011-09-10 DIAGNOSIS — F1010 Alcohol abuse, uncomplicated: Secondary | ICD-10-CM

## 2011-09-10 DIAGNOSIS — Z3490 Encounter for supervision of normal pregnancy, unspecified, unspecified trimester: Secondary | ICD-10-CM

## 2011-09-10 DIAGNOSIS — O9989 Other specified diseases and conditions complicating pregnancy, childbirth and the puerperium: Secondary | ICD-10-CM | POA: Insufficient documentation

## 2011-09-10 DIAGNOSIS — F1410 Cocaine abuse, uncomplicated: Secondary | ICD-10-CM | POA: Insufficient documentation

## 2011-09-10 HISTORY — DX: Other psychoactive substance abuse, uncomplicated: F19.10

## 2011-09-10 HISTORY — DX: Encounter for supervision of normal pregnancy, unspecified, unspecified trimester: Z34.90

## 2011-09-10 LAB — CBC
Hemoglobin: 13.2 g/dL (ref 12.0–15.0)
MCHC: 36.7 g/dL — ABNORMAL HIGH (ref 30.0–36.0)
Platelets: 292 10*3/uL (ref 150–400)
RDW: 12.6 % (ref 11.5–15.5)

## 2011-09-10 LAB — COMPREHENSIVE METABOLIC PANEL
ALT: 17 U/L (ref 0–35)
AST: 18 U/L (ref 0–37)
Albumin: 3.6 g/dL (ref 3.5–5.2)
Alkaline Phosphatase: 53 U/L (ref 39–117)
Glucose, Bld: 92 mg/dL (ref 70–99)
Potassium: 4 mEq/L (ref 3.5–5.1)
Sodium: 138 mEq/L (ref 135–145)
Total Protein: 7.1 g/dL (ref 6.0–8.3)

## 2011-09-10 LAB — RAPID URINE DRUG SCREEN, HOSP PERFORMED
Amphetamines: NOT DETECTED
Barbiturates: NOT DETECTED
Cocaine: POSITIVE — AB
Tetrahydrocannabinol: NOT DETECTED

## 2011-09-10 MED ORDER — ACETAMINOPHEN 325 MG PO TABS: 650.0000 mg | ORAL_TABLET | ORAL | Status: DC | PRN

## 2011-09-10 MED ORDER — ONDANSETRON HCL 8 MG PO TABS: 4.0000 mg | ORAL_TABLET | Freq: Three times a day (TID) | ORAL | Status: DC | PRN

## 2011-09-10 NOTE — ED Notes (Signed)
Okay per Dr Jeraldine Loots

## 2011-09-10 NOTE — ED Notes (Signed)
Pt requesting an enema, states her last normal bowel movement was two days ago, small bowel movements since that time, MD Linker notified, no new orders.

## 2011-09-10 NOTE — ED Notes (Signed)
MD Linker notified of patient concern about the patient having green bowel movements, pt denies diarrhea. No distress.
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
## 2011-09-10 NOTE — ED Notes (Signed)
Pt. Stated, I'm here cause of substance abuse and I'm pregnant.  Last night crack for the last 8 days,and 10  LOCOs   10 20 rocks.

## 2011-09-10 NOTE — ED Notes (Signed)
Patient request detox from cocaine (last used was [facility]) and she states she is [redacted] weeks pregnant. Patient denies any pain or N/V/D/F. Patient allowed to shower and was placed in blue paper scrubs. Patient belongings inventoried and placed in locker 9.

## 2011-09-11 ENCOUNTER — Emergency Department (HOSPITAL_COMMUNITY): Payer: Self-pay

## 2011-09-11 MED ORDER — SENNOSIDES 8.8 MG/5ML PO SYRP
5.0000 mL | ORAL_SOLUTION | Freq: Once | ORAL | Status: AC
  Administered 2011-09-11: 5 mL via ORAL
  Filled 2011-09-11 (×2): qty 5

## 2011-09-11 NOTE — ED Notes (Signed)
The patient continues to await [facility] placement.  Per RN there were no events overnight.  BH requests new [place] to determine fetal status, given the absence of pre-natal care.  Gerhard Munch, MD 09/11/11 [id number]

## 2011-09-11 NOTE — ED Notes (Signed)
[place] to be ordered for clearance at [facility].

## 2011-09-11 NOTE — ED Notes (Signed)
Patient transported to Ultrasound 

## 2011-09-11 NOTE — ED Provider Notes (Signed)
Medical screening examination/treatment/procedure(s) were performed by non-physician practitioner and as supervising physician I was immediately available for consultation/collaboration.  Terissa Haffey, MD 09/11/11 0737 
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
## 2011-09-12 ENCOUNTER — Telehealth (HOSPITAL_COMMUNITY): Payer: Self-pay | Admitting: Licensed Clinical Social Worker

## 2011-09-12 MED ORDER — SENNOSIDES 8.8 MG/5ML PO SYRP
5.0000 mL | ORAL_SOLUTION | Freq: Once | ORAL | Status: AC
  Administered 2011-09-12: 5 mL via ORAL

## 2011-09-12 NOTE — ED Notes (Signed)
Pt c/o constipation. Dr Judd Lien informed [place] given.

## 2011-09-12 NOTE — ED Notes (Signed)
Turkey sandwich given 

## 2011-09-12 NOTE — ED Notes (Signed)
[facility] advised they will not be accepting patient due to patient current pregnancy and advised patient may be able to be placed at [facility]. ACT team member, Irving Burton advised and will advise of status.

## 2011-09-12 NOTE — Progress Notes (Signed)
Patient ID: [name] [name], female   DOB: 1985-05-21, 26 y.o.   MRN: [id number] Pt familiar to Dr Javier Glazier and recommendation for [facility] consult made and communicated yesterday. OB consult still indicated and recommended. As pt is unable to consume for the safety of the fetus any detox medications available here, then [facility] referral could be helpful for the patient.  Pt can not benefit from admission to [facility] per clinical review by medical director Dr Harvie Heck Readling and agreed on by myself. Dorian Heckle Dan Humphreys, MD, [facility] Certified in Psychiatry, Child-Adolescent Psychiatry with Forensic Psychiatry and Addiction Medicine experience.
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
## 2011-09-13 NOTE — Progress Notes (Signed)
Telepsych consult recommended social work consult to place pt.

## 2011-09-13 NOTE — ED Notes (Signed)
Pt seen by Tele psych. Recommended discharged. Social work consult  called for placement
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
## 2011-09-13 NOTE — ED Notes (Signed)
Unable to assist pt with securing beds at [facility], [facility] or [facility].  Pt given list of appropriate resources to f/u with on her own..  Per Delice Bison, CSW, pt told to f/u with [facility] in am as bed may become available.

## 2011-09-13 NOTE — ED Notes (Signed)
Pt did not like meal tray given to her, reordered per pt menu. nad noted, abc intact.

## 2011-09-13 NOTE — ED Notes (Signed)
Spoke with pt re shelter options.  Pt encouraged to f/u in the am with area shelters.  Pt states that she has no family in area and her bf is in jail in [place].  Pt hopes her bf will be [place] [facility] (charges pending here) once he serves his time in [place].  Pt states that they will then stay with his brother in [place].  Pt does not know how to contact pt's brother or where he lives.  Pt encouraged to focus her concern on her/baby and do what is best for her and the baby.  Pt says she will just go back on the street and drink b/c she can't be expected to stay sober on the street.  CSW explained that pt has a choice not to drink.  Pt disagreed.  Pt not interested in staying in lobby over nite so CSW could help her explore further shelter options in the am.

## 2011-09-13 NOTE — ED Notes (Signed)
Pt sitting on side of bed working on paperwork.  Denies any new needs at this time. Pt is calm and cooperative.

## 2011-09-13 NOTE — ED Notes (Signed)
Patient is taking shower. 

## 2011-09-13 NOTE — Progress Notes (Signed)
Sound asleep on rounds.  Has been here three days.  Was here requesting detox from opiates, alcohol.  Having problem placing her because she is [redacted] weeks pregnant.  Requesting telepsych to see if she can be discharged to outpatient followup.

## 2011-09-13 NOTE — ED Provider Notes (Signed)
Patient currently does not qualify for any detox programs currently. Social service was attempting to find a place for her to stay but patient states she does not wish to stay so she will be discharged with resource guide to try and arrange for outpatient treatments.  Dione Booze, MD 09/13/11 1754

## 2011-09-13 NOTE — ED Notes (Signed)
[facility] and [facility]t for pt. Spoke with [name] [name]. appt is July 25 at 0800 am.

## 2011-09-13 NOTE — ED Notes (Signed)
pt states that she does not want to stay anymore- social work and ACT team has spoken with pt about possible bed being available in AM- pt still does not want to stay; pt also states that she will not wait for d/c instructions and "does not care if the doctor will discharge her with follow-ups"; at time pt will be leaving AMA

## 2011-09-27 ENCOUNTER — Telehealth: Payer: Self-pay | Admitting: Obstetrics & Gynecology

## 2011-09-27 ENCOUNTER — Encounter: Payer: Self-pay | Admitting: Obstetrics & Gynecology

## 2011-09-27 NOTE — Telephone Encounter (Signed)
Called patient. However, the person whom answered the phone said he was the brother-in-law, and Ms. Ilyas had not lived there in five years. He asked that I remove the number. He also stated patient was in [place].

## 2011-11-22 ENCOUNTER — Encounter (HOSPITAL_COMMUNITY): Payer: Self-pay | Admitting: Emergency Medicine

## 2011-11-22 ENCOUNTER — Emergency Department (HOSPITAL_COMMUNITY)
Admission: EM | Admit: 2011-11-22 | Discharge: 2011-11-22 | Disposition: A | Discharge status: Home / Self Care | Payer: Self-pay | Attending: Emergency Medicine | Admitting: Emergency Medicine

## 2011-11-22 DIAGNOSIS — F1010 Alcohol abuse, uncomplicated: Secondary | ICD-10-CM | POA: Insufficient documentation

## 2011-11-22 DIAGNOSIS — R1030 Lower abdominal pain, unspecified: Secondary | ICD-10-CM

## 2011-11-22 DIAGNOSIS — F1410 Cocaine abuse, uncomplicated: Secondary | ICD-10-CM | POA: Insufficient documentation

## 2011-11-22 DIAGNOSIS — Z3490 Encounter for supervision of normal pregnancy, unspecified, unspecified trimester: Secondary | ICD-10-CM

## 2011-11-22 DIAGNOSIS — R109 Unspecified abdominal pain: Secondary | ICD-10-CM | POA: Insufficient documentation

## 2011-11-22 DIAGNOSIS — O99330 Smoking (tobacco) complicating pregnancy, unspecified trimester: Secondary | ICD-10-CM | POA: Insufficient documentation

## 2011-11-22 DIAGNOSIS — O99340 Other mental disorders complicating pregnancy, unspecified trimester: Secondary | ICD-10-CM | POA: Insufficient documentation

## 2011-11-22 DIAGNOSIS — O9989 Other specified diseases and conditions complicating pregnancy, childbirth and the puerperium: Secondary | ICD-10-CM | POA: Insufficient documentation

## 2011-11-22 LAB — WET PREP, GENITAL
Clue Cells Wet Prep HPF POC: NONE SEEN
Trich, Wet Prep: NONE SEEN

## 2011-11-22 LAB — URINALYSIS, ROUTINE W REFLEX MICROSCOPIC
Hgb urine dipstick: NEGATIVE
Ketones, ur: 40 mg/dL — AB
Protein, ur: NEGATIVE mg/dL
Urobilinogen, UA: 1 mg/dL (ref 0.0–1.0)

## 2011-11-22 LAB — URINE MICROSCOPIC-ADD ON

## 2011-11-22 MED ORDER — LIDOCAINE HCL (PF) 1 % IJ SOLN
INTRAMUSCULAR | Status: AC
  Administered 2011-11-22: 5 mL
  Filled 2011-11-22: qty 5

## 2011-11-22 MED ORDER — AZITHROMYCIN 250 MG PO TABS
1000.0000 mg | ORAL_TABLET | Freq: Once | ORAL | Status: AC
  Administered 2011-11-22: 1000 mg via ORAL
  Filled 2011-11-22: qty 4

## 2011-11-22 MED ORDER — CEFTRIAXONE SODIUM 250 MG IJ SOLR
250.0000 mg | Freq: Once | INTRAMUSCULAR | Status: AC
  Administered 2011-11-22: 250 mg via INTRAMUSCULAR
  Filled 2011-11-22: qty 250

## 2011-11-22 NOTE — ED Notes (Addendum)
OB Rapid Response RN called - she is en route.

## 2011-11-22 NOTE — ED Provider Notes (Signed)
History     CSN: [id number]  Arrival date & time 11/22/11  1018   First MD Initiated Contact with Patient 11/22/11 1026      Chief Complaint  Patient presents with  . Abdominal Pain    (Consider location/radiation/quality/duration/timing/severity/associated sxs/prior treatment) HPI  G2P1 pt currently pregnant presents c/o lower abd pain x 2 days.  Pain is sharp, wax and wane lasting 10-15 minutes.  Nothing makes pain better or worse.  No other complaint specifically no fever, chills, cp, sob, back pain, urinary sxs, vaginal bleeding or discharge, rash, or recent trauma.  Does has nausea and occasional vomiting for the past 2 weeks.  Has been noticing normal baby movement.  Has prior hx of substance abuse (crack), last use 4 days ago. Has hx of alcohol abuse. Denies alcohol use.  First pregnancy were complicated with premature (3 weeks), however her child has been healthy.  LMP 5 months ago.    Past Medical History  Diagnosis Date  . No pertinent past medical history   . Pregnancy   . Substance abuse     Past Surgical History  Procedure Date  . No past surgeries     No family history on file.  History  Substance Use Topics  . Smoking status: Current Every Day Smoker -- 1.0 packs/day  . Smokeless tobacco: Not on file  . Alcohol Use: 25.2 oz/week    42 Cans of beer per week    OB History    Grav Para Term Preterm Abortions TAB SAB Ect Mult Living   1               Review of Systems  All other systems reviewed and are negative.    Allergies  Review of patient's allergies indicates no known allergies.  Home Medications   Current Outpatient Rx  Name Route Sig Dispense Refill  . ACETAMINOPHEN 325 MG PO TABS Oral Take 650 mg by mouth every 6 (six) hours as needed. For pain    . PRENATAL MULTIVITAMIN CH Oral Take 1 tablet by mouth daily. For pregnancy health      BP 115/75  Pulse 96  Temp 97.8 F (36.6 C) (Oral)  Resp 18  SpO2 97%  LMP  06/09/2011  Physical Exam  Nursing note and vitals reviewed. Constitutional: She appears well-developed and well-nourished. No distress.  HENT:  Head: Normocephalic and atraumatic.  Eyes: Conjunctivae normal are normal.  Neck: Normal range of motion. Neck supple.  Cardiovascular: Normal rate and regular rhythm.   Pulmonary/Chest: Effort normal and breath sounds normal. She exhibits no tenderness.  Abdominal: Soft. There is no tenderness.  Genitourinary: Vagina normal and uterus normal. There is no rash or lesion on the right labia. There is no rash or lesion on the left labia. Uterus is not tender. Cervix exhibits discharge. Cervix exhibits no motion tenderness and no friability. Right adnexum displays no mass and no tenderness. Left adnexum displays no mass and no tenderness. No erythema, tenderness or bleeding around the vagina. No vaginal discharge found.  Lymphadenopathy:       Right: No inguinal adenopathy present.       Left: No inguinal adenopathy present.    ED Course  Procedures (including critical care time)   Labs Reviewed  HCG, QUANTITATIVE, PREGNANCY  URINALYSIS, ROUTINE W REFLEX MICROSCOPIC  ABO/RH  WET PREP, GENITAL  GC/CHLAMYDIA PROBE AMP, GENITAL   Results for orders placed during the hospital encounter of 11/22/11  HCG, QUANTITATIVE, PREGNANCY  Component Value Range   hCG, Beta Chain, [place] [address] (*) <5 mIU/mL  URINALYSIS, ROUTINE W REFLEX MICROSCOPIC      Component Value Range   Color, Urine AMBER (*) YELLOW   APPearance CLEAR  CLEAR   Specific Gravity, Urine 1.028  1.005 - 1.030   pH 6.0  5.0 - 8.0   Glucose, UA NEGATIVE  NEGATIVE mg/dL   Hgb urine dipstick NEGATIVE  NEGATIVE   Bilirubin Urine SMALL (*) NEGATIVE   Ketones, ur 40 (*) NEGATIVE mg/dL   Protein, ur NEGATIVE  NEGATIVE mg/dL   Urobilinogen, UA 1.0  0.0 - 1.0 mg/dL   Nitrite NEGATIVE  NEGATIVE   Leukocytes, UA SMALL (*) NEGATIVE  ABO/RH      Component Value Range   ABO/RH(D) B POS      No rh immune globuloin NOT A RH IMMUNE GLOBULIN CANDIDATE, PT RH POSITIVE    WET PREP, GENITAL      Component Value Range   Yeast Wet Prep HPF POC NONE SEEN  NONE SEEN   Trich, Wet Prep NONE SEEN  NONE SEEN   Clue Cells Wet Prep HPF POC NONE SEEN  NONE SEEN   WBC, Wet Prep HPF POC TOO NUMEROUS TO COUNT (*) NONE SEEN  URINE MICROSCOPIC-ADD ON      Component Value Range   Squamous Epithelial / LPF FEW (*) RARE   WBC, UA 3-6  <3 WBC/hpf   RBC / HPF 0-2  <3 RBC/hpf   Bacteria, UA MANY (*) RARE   No results found.  1. Pregnancy 2. Lower abdominal pain   MDM  Pt who is `[redacted] weeks pregnant presents with lower abd pain.  Abd is gravid on exam, non surgical.  Work up initiated.   11:33 AM Pelvic exam is nontender, however moderate yellow discharge noted in vaginal vault.  Wet prep along with CG/Chlamydia culture obtained.  Will perform obs transvaginal US for further evaluation.  Will have OB nurse to evaluate pt as well. Will move pt to CDU.  Discussed with CDU PA who will resumed care.  Discussed with my attending.     12:08 PM OB nurse has evaluated the patient.  She recommend cancel transvaginal US and also recommend pt to f/u at [facility] Department for further evaluation.    12:52 PM Wet prep with WBC TNTC.  Since pt has lower abd pain and evidence of WBC, i will treat with rocephin/zithromax here in ER.  Otherwise pt is stable to be discharge as OB nurse has evaluated here and noted that her fetus is active and there are no obvious complication.  Fetal heart rate is 140.  No active contraction.  Pt voice understanding and agrees with plan.  Pt able to tolerates PO.    Fayrene Helper, PA-C 11/22/11 1256

## 2011-11-22 NOTE — Progress Notes (Signed)
[facility] ED called regarding pt here c/o of lower abd pain. [facility] 03/17/12. OB RR RN in route

## 2011-11-22 NOTE — Progress Notes (Signed)
Phoned Dr. Henderson Cloud regarding pt c/o sharp abd pain every 20-25 min with no vaginal bleeding or leaking. GC/CH, wet prep, trich being resulted and U/A states many bacteria. ED to treat. Dr. Henderson Cloud stated if no cervical dilation pt may be discharged and to obtain prenatal care with health department.

## 2011-11-22 NOTE — Progress Notes (Signed)
At pt bedside getting history. Pt has not received [facility] except visits to ED departments where she had an u/s at [facility] to determine [facility] 03/17/12. Pt states she is in the process of getting papers so she can get medicaid. Encouraged pt seek [facility] with the health department. Pt states she has sharp pain in lower abd every 20-25 min. Pt denies vaginal bleeding or leaking of fluid. Pt states she is starting to feel positive fetal movement. Pt also reports using drugs (crack) and alcohol this pregnancy including binging last week. Pt states she had a preterm delivery (3 months early) in 2004 weighing 2lb 7oz.Marland Kitchen

## 2011-11-22 NOTE — ED Notes (Addendum)
Pt states she is pregnant and having lower abd pain. States that the last time she experienced lower abd pain they told her it was her "uterus stretching."  Pain is intermittent lasting up to 15 minutes per episode. Vomiting about 2x per day for the last 2 weeks.

## 2011-11-23 LAB — GC/CHLAMYDIA PROBE AMP, GENITAL: Chlamydia, DNA Probe: NEGATIVE

## 2011-11-26 NOTE — ED Provider Notes (Signed)
Medical screening examination/treatment/procedure(s) were performed by non-physician practitioner and as supervising physician I was immediately available for consultation/collaboration.   Tomeca Helm E Azariel Banik, MD 11/26/11 0840 

## 2012-01-07 ENCOUNTER — Encounter: Payer: Self-pay | Admitting: Family Medicine

## 2012-01-18 ENCOUNTER — Inpatient Hospital Stay (HOSPITAL_COMMUNITY)
Admission: AD | Admit: 2012-01-18 | Discharge: 2012-01-18 | Disposition: A | Discharge status: Home / Self Care | Payer: Self-pay | Source: Ambulatory Visit | Attending: Obstetrics and Gynecology | Admitting: Obstetrics and Gynecology

## 2012-01-18 ENCOUNTER — Encounter (HOSPITAL_COMMUNITY): Payer: Self-pay | Admitting: *Deleted

## 2012-01-18 DIAGNOSIS — O99340 Other mental disorders complicating pregnancy, unspecified trimester: Secondary | ICD-10-CM | POA: Insufficient documentation

## 2012-01-18 DIAGNOSIS — O0930 Supervision of pregnancy with insufficient antenatal care, unspecified trimester: Secondary | ICD-10-CM | POA: Insufficient documentation

## 2012-01-18 DIAGNOSIS — Z5900 Homelessness unspecified: Secondary | ICD-10-CM | POA: Insufficient documentation

## 2012-01-18 DIAGNOSIS — O99320 Drug use complicating pregnancy, unspecified trimester: Secondary | ICD-10-CM

## 2012-01-18 DIAGNOSIS — F1410 Cocaine abuse, uncomplicated: Secondary | ICD-10-CM | POA: Insufficient documentation

## 2012-01-18 LAB — RAPID URINE DRUG SCREEN, HOSP PERFORMED
Amphetamines: NOT DETECTED
Barbiturates: NOT DETECTED
Benzodiazepines: NOT DETECTED
Cocaine: POSITIVE — AB

## 2012-01-18 LAB — URINE MICROSCOPIC-ADD ON

## 2012-01-18 LAB — URINALYSIS, ROUTINE W REFLEX MICROSCOPIC
Bilirubin Urine: NEGATIVE
Glucose, UA: NEGATIVE mg/dL
Ketones, ur: NEGATIVE mg/dL
Specific Gravity, Urine: >1.03 — ABNORMAL HIGH (ref 1.005–1.030)
pH: 6 (ref 5.0–8.0)

## 2012-01-18 MED ORDER — PRENATAL VITAMINS (DIS) PO TABS: 1.0000 | ORAL_TABLET | Freq: Every day | ORAL | Status: DC

## 2012-01-18 NOTE — MAU Provider Note (Signed)
History     CSN: [id number]  Arrival date and time: 01/18/12 1101   None     Chief Complaint  Patient presents with  . Addiction Problem   HPI Patient is a 26 yo G2P0101 at [redacted]w[redacted]d presenting for evaluation of fetal wellbeing. Patient is a known substance user and is interested in quitting. She uses alcohol (drinks 3-4, 40oz beers per day. Denies liquor use) as well as crack cocaine (smokes- last use yesterday. Uses 4-5 times per week) Has used crack off and on during pregnancy, but reports using in early pregnancy around 3 months. (States she was in a program when she first found out she was pregnant, but started using as soon as that program was over.) She denies any other substance use currently. Patient has not established prenatal care, but has a history of preterm delivery 34 months early. Patient is homeless currently and requesting help in a program.   OB History    Grav Para Term Preterm Abortions TAB SAB Ect Mult Living   2 1 0 1 0 0 0 0 0 1       Past Medical History  Diagnosis Date  . No pertinent past medical history   . Pregnancy   . Substance abuse     Past Surgical History  Procedure Date  . No past surgeries     No family history on file.  History  Substance Use Topics  . Smoking status: Current Every Day Smoker -- 1.0 packs/day  . Smokeless tobacco: Not on file  . Alcohol Use: 25.2 oz/week    42 Cans of beer per week    Allergies: No Known Allergies  Prescriptions prior to admission  Medication Sig Dispense Refill  . acetaminophen (TYLENOL) 325 MG tablet Take 650 mg by mouth every 6 (six) hours as needed. For pain      . Prenatal Vit-Fe Fumarate-FA (PRENATAL MULTIVITAMIN) TABS Take 1 tablet by mouth daily. For pregnancy health        Review of Systems  Constitutional: Negative for fever and chills.  Respiratory: Negative for shortness of breath.   Cardiovascular: Negative for chest pain.  Gastrointestinal: Positive for nausea and vomiting.    Genitourinary: Negative for dysuria.  Skin: Positive for rash.  Neurological: Negative for dizziness and headaches.   Physical Exam   Blood pressure 134/81, pulse 107, temperature 98 F (36.7 C), temperature source Oral, resp. rate 18, height 5\' 7"  (1.702 m), weight 96.072 kg (211 lb 12.8 oz), last menstrual period 06/09/2011.  Physical Exam  Constitutional: She is oriented to person, place, and time. She appears well-developed. No distress.  HENT:  Head: Normocephalic and atraumatic.  Cardiovascular: Normal rate and regular rhythm.   Respiratory: Effort normal and breath sounds normal.  GI: Soft. There is no tenderness.       Gravid. Toco in place.  Musculoskeletal: Normal range of motion. She exhibits no edema and no tenderness.  Neurological: She is alert and oriented to person, place, and time.  Skin: Skin is dry. No rash noted.    MAU Course  Procedures  FHR: Baseline 140, moderate variability with 10x10 acels, no decels No contractions noted  MDM 11:45am- Patient seen and examined. FHT reviewed with Dr. Thad Ranger, and appears Category I. Will continue to monitor, and call CSW for consult. 2:15pm- Dr. Thad Ranger spoke with CSW. No current bed availabilities for inpatient facilities. Will discuss with Dr. Jolayne Panther 2:35pm- Discussed with Dr. Jolayne Panther. Baby looks good on monitor and since there are  limited resources on this Friday afternoon, patient should return to MAU on Monday to get plugged in with more resources. If bed is available on Monday, she will need a current TB test therefore we will order Quantiferon before she leaves. Will complete appropriate paperwork with patient prior to discharge from MAU. 3:30pm- Completed [facility] paperwork of my assessment today. 5:30pm- Patient re-evaluated. ACT team is not coming to MAU to evaluate patient, and she was made aware of that. She has had extended monitoring and is doing well. Discussed plan with patient that  she should return to MAU Monday morning to discuss appointment as well as possible inpatient placement. Patient states that she may go to [facility] for evaluation before Monday since ACT team is not coming here. I gave her reassurance that the baby is doing well, but she should avoid all substances until she returns.  Assessment and Plan  26 yo G2P0101 at [redacted]w[redacted]d presenting for evaluation of fetal well being.  - Fetal wellbeing Category I - Patient to return to MAU on Monday morning to make appropriate calls for inpatient treatment. CSW has worked on getting beds across [place] and is unable today. This must be completed Monday, but not likely to be completed before then.  - We will message our clinic now to arrange prenatal appointment (including labs and ultrasound) as soon as possible. We will hopefully be able to give her that appointment when she returns. - Patient encouraged to start taking prenatal vitamin - Avoid all substances - Paperwork was completed for [facility] and given to CSW to fax - All questions and concerns addressed - Discharged from MAU in stable condition.  - Discussed with Dr. Napoleon Form  Khiem Gargis 01/18/2012, 11:45 AM

## 2012-01-18 NOTE — Progress Notes (Addendum)
CSW met with pt to assist with substance abuse treatment and housing options.  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  She started using at 26 years old, after running away from home.  She admits to using crack, off and on since then.  She admits to using 3 days out of a week.  She denies any criminal activity to support her habit, rather states she has friends that buy it for her.  She last used yesterday and presented to MAU requesting in-patient substance abuse treatment.  Pt has a 75 year old son, who was removed from her custody by [facility] CPS and eventually adopted by her aunt.  She has minimum contact with family.  Pt was hospitalized in 6/13 at [facility] for treatment and then transferred to [facility] where she stayed for 3 weeks.  Pt was offered long term in-patient treatment (by [facility] [place], as per pt.  CSW explained that she would have to be willing to go wherever a bed was available throughout the state of .  She told Sw that she is committed to treatment and does not want CPS to remove this baby from her.  CSW completed a referral form to [facility] treatment facility in [place], [place] and faxed it to [place], admission coordinator.  The intake worker will not be available to talk with this pt until Monday.  They expect to have a bed available on Monday but pt is not guaranteed a bed, at this time.  Pt will need a TB test prior to admission, if accepted.  Pt is medically stable to discharge.  CSW called the ACT team to request an evaluation for possible admission to [facility], until a substance abuse bed is secured.  CSW waiting for a call back.  Pt is familiar with the local shelters in the area but not interested, as she told worker that a lot of drug activity takes place in the shelters.  She stayed with a friend last night but does not want to go back.  If pt does not meet criteria for admission to [facility], CSW is able to provide pt with bus vouchers to leave and return  on Monday for clinic appointment.  She is not interested in shelters this CSW has to offer.  She denies any SI or HI.  CSW will continue to work on getting pt admitted to facility on Monday, if she is discharged and returns.

## 2012-01-18 NOTE — MAU Provider Note (Signed)
I saw and examined patient and reviewed NST (reactive) and available medical records. I talked with Child psychotherapist and discussed with attending (Dr. Jolayne Panther). Please see SW note for further details. Bed available in [facility] Monday. Pt will need to interview on Monday for admission to program. Paperwork for program completed and left with MAU. No shelters available at this time either. Pt instructed to return Monday to meet with SW and see if able to get into program. Pt will get appointment set up at [facility] but that we cannot get appt today as office is closed. Patient expressed that she had nowhere to go except with friends how use and that she would have no choice but to use in that environment. Encouraged patient to stay clean until Monday when she can hopefully get into treatment program.  Napoleon Form, MD

## 2012-01-18 NOTE — MAU Note (Signed)
Pt reports she has a crack cocaine and alcohol abuse. Wants to check on baby and get help for substance abuse.

## 2012-01-19 LAB — URINE CULTURE: Colony Count: 55000

## 2012-01-21 LAB — QUANTIFERON TB GOLD ASSAY (BLOOD): Interferon Gamma Release Assay: NEGATIVE

## 2012-02-11 ENCOUNTER — Inpatient Hospital Stay (HOSPITAL_COMMUNITY)
Admission: AD | Admit: 2012-02-11 | Discharge: 2012-02-11 | Disposition: A | Discharge status: Home / Self Care | Payer: Medicaid Other | Source: Ambulatory Visit | Attending: Obstetrics & Gynecology | Admitting: Obstetrics & Gynecology

## 2012-02-11 ENCOUNTER — Encounter: Payer: Self-pay | Admitting: Family Medicine

## 2012-02-11 ENCOUNTER — Encounter (HOSPITAL_COMMUNITY): Payer: Self-pay | Admitting: *Deleted

## 2012-02-11 DIAGNOSIS — R109 Unspecified abdominal pain: Secondary | ICD-10-CM | POA: Insufficient documentation

## 2012-02-11 DIAGNOSIS — Z0489 Encounter for examination and observation for other specified reasons: Secondary | ICD-10-CM | POA: Insufficient documentation

## 2012-02-11 DIAGNOSIS — O9989 Other specified diseases and conditions complicating pregnancy, childbirth and the puerperium: Secondary | ICD-10-CM

## 2012-02-11 DIAGNOSIS — R102 Pelvic and perineal pain: Secondary | ICD-10-CM

## 2012-02-11 DIAGNOSIS — O99891 Other specified diseases and conditions complicating pregnancy: Secondary | ICD-10-CM | POA: Insufficient documentation

## 2012-02-11 DIAGNOSIS — N949 Unspecified condition associated with female genital organs and menstrual cycle: Secondary | ICD-10-CM

## 2012-02-11 LAB — RAPID URINE DRUG SCREEN, HOSP PERFORMED
Benzodiazepines: NOT DETECTED
Cocaine: NOT DETECTED
Opiates: NOT DETECTED

## 2012-02-11 LAB — WET PREP, GENITAL

## 2012-02-11 LAB — URINALYSIS, ROUTINE W REFLEX MICROSCOPIC
Leukocytes, UA: NEGATIVE
Nitrite: NEGATIVE
Specific Gravity, Urine: 1.02 (ref 1.005–1.030)
Urobilinogen, UA: 1 mg/dL (ref 0.0–1.0)
pH: 7 (ref 5.0–8.0)

## 2012-02-11 NOTE — MAU Provider Note (Signed)
  History     CSN: [id number]  Arrival date and time: 02/11/12 1729   None     Chief Complaint  Patient presents with  . Abdominal Pain   HPI  [name] [name] is a 26 y.o. G2P0101 who presents today for pregnancy verification, and a UDS. She had been using crack cocaine, but has stopped and needs proof of negative UDS. She is also concerned about a vaginal discharge.   Past Medical History  Diagnosis Date  . No pertinent past medical history   . Pregnancy   . Substance abuse     Past Surgical History  Procedure Date  . No past surgeries     No family history on file.  History  Substance Use Topics  . Smoking status: Current Every Day Smoker -- 1.0 packs/day  . Smokeless tobacco: Not on file  . Alcohol Use: 3.0 oz/week    5 Cans of beer per week    Allergies: No Known Allergies  Prescriptions prior to admission  Medication Sig Dispense Refill  . acetaminophen (TYLENOL) 325 MG tablet Take 650 mg by mouth every 6 (six) hours as needed. For pain      . Prenatal Vitamins (DIS) TABS Take 1 tablet by mouth daily.  90 tablet  3    Review of Systems  Constitutional: Negative for fever.  Eyes: Negative for blurred vision.  Gastrointestinal: Positive for abdominal pain (She points to the area of the RL). Negative for nausea, vomiting, diarrhea and constipation.  Genitourinary: Negative for dysuria, urgency and frequency.  Musculoskeletal: Negative for myalgias.  Neurological: Negative for dizziness and headaches.   Physical Exam   Blood pressure 99/81, pulse 88, temperature 97.8 F (36.6 C), temperature source Oral, resp. rate 18, height 5\' 8"  (1.727 m), weight 97.614 kg (215 lb 3.2 oz), last menstrual period 06/09/2011.  Physical Exam  Nursing note and vitals reviewed. Constitutional: She is oriented to person, place, and time. She appears well-developed and well-nourished. No distress.  Cardiovascular: Normal rate.   Respiratory: Effort normal.  GI: Soft.   Genitourinary:        External: normal Vagina: small amount of thin white discharge Cervix: closed/thick/high Uterus: AGA FHT: 130, moderate with 15x15 accels. No decels Toco: no UCs  Neurological: She is alert and oriented to person, place, and time.  Skin: Skin is warm and dry.  Psychiatric: She has a normal mood and affect.    MAU Course  Procedures    Assessment and Plan   1. Pain of round ligament complicating pregnancy, antepartum    Wet prep: pending, patient has to leave to catch a bus. She will call for results. UDS is also not back prior to patient leaving. She will need a copy for DSS Third trimester danger signs reviewed.   Tawnya Crook 02/11/2012, 8:07 PM

## 2012-02-11 NOTE — MAU Note (Signed)
Sharp lower abd pain x 3 days, denies bleeding or LOF.  Pt was using crack, also alcohol.. Has been clean x 1 1/2 months.  No PNC.

## 2012-02-11 NOTE — MAU Note (Signed)
Pt states she needs a pregnancy verification letter, and a copy of her drug screen to take to the DSS. Pt states she is not having any pain at this time

## 2012-02-11 NOTE — Progress Notes (Signed)
Pt states she was treated in 2012 "I am not suicidal,or homicidal or any of that stuff

## 2012-02-12 NOTE — MAU Provider Note (Signed)
Pt should seek prenatal care.  Attestation of Attending Supervision of Advanced Practitioner (CNM/NP): Evaluation and management procedures were performed by the Advanced Practitioner under my supervision and collaboration. I have reviewed the Advanced Practitioner's note and chart, and I agree with the management and plan.  Caelin Rayl H. 5:28 AM

## 2012-02-18 ENCOUNTER — Encounter (HOSPITAL_COMMUNITY): Payer: Self-pay | Admitting: *Deleted

## 2012-02-18 ENCOUNTER — Encounter: Payer: Self-pay | Admitting: Advanced Practice Midwife

## 2012-02-18 ENCOUNTER — Inpatient Hospital Stay (HOSPITAL_COMMUNITY)
Admission: AD | Admit: 2012-02-18 | Discharge: 2012-02-21 | DRG: 775 | Disposition: A | Discharge status: Home / Self Care | Payer: Medicaid Other | Source: Ambulatory Visit | Attending: Obstetrics and Gynecology | Admitting: Obstetrics and Gynecology

## 2012-02-18 DIAGNOSIS — O99344 Other mental disorders complicating childbirth: Secondary | ICD-10-CM | POA: Diagnosis present

## 2012-02-18 DIAGNOSIS — F1410 Cocaine abuse, uncomplicated: Secondary | ICD-10-CM | POA: Diagnosis present

## 2012-02-18 DIAGNOSIS — O361990 Maternal care for other isoimmunization, unspecified trimester, not applicable or unspecified: Secondary | ICD-10-CM | POA: Diagnosis present

## 2012-02-18 DIAGNOSIS — O99814 Abnormal glucose complicating childbirth: Secondary | ICD-10-CM | POA: Diagnosis present

## 2012-02-18 DIAGNOSIS — Z22330 Carrier of Group B streptococcus: Secondary | ICD-10-CM

## 2012-02-18 DIAGNOSIS — O4290 Premature rupture of membranes, unspecified as to length of time between rupture and onset of labor, unspecified weeks of gestation: Secondary | ICD-10-CM | POA: Diagnosis present

## 2012-02-18 DIAGNOSIS — O0930 Supervision of pregnancy with insufficient antenatal care, unspecified trimester: Secondary | ICD-10-CM

## 2012-02-18 DIAGNOSIS — O99892 Other specified diseases and conditions complicating childbirth: Secondary | ICD-10-CM | POA: Diagnosis present

## 2012-02-18 LAB — CBC
HCT: 33.8 % — ABNORMAL LOW (ref 36.0–46.0)
Hemoglobin: 12.1 g/dL (ref 12.0–15.0)
MCH: 30.6 pg (ref 26.0–34.0)
MCHC: 35.8 g/dL (ref 30.0–36.0)
MCV: 85.4 fL (ref 78.0–100.0)
Platelets: 316 K/uL (ref 150–400)
RBC: 3.96 MIL/uL (ref 3.87–5.11)
RDW: 12.5 % (ref 11.5–15.5)
WBC: 15.5 K/uL — ABNORMAL HIGH (ref 4.0–10.5)

## 2012-02-18 LAB — RAPID URINE DRUG SCREEN, HOSP PERFORMED
Amphetamines: NOT DETECTED
Benzodiazepines: NOT DETECTED
Opiates: NOT DETECTED

## 2012-02-18 LAB — RAPID HIV SCREEN (WH-MAU): Rapid HIV Screen: NONREACTIVE

## 2012-02-18 LAB — OB RESULTS CONSOLE GBS: GBS: POSITIVE

## 2012-02-18 LAB — GLUCOSE, CAPILLARY: Glucose-Capillary: 87 mg/dL (ref 70–99)

## 2012-02-18 LAB — POCT FERN TEST: POCT Fern Test: POSITIVE

## 2012-02-18 LAB — OB RESULTS CONSOLE HIV ANTIBODY (ROUTINE TESTING): HIV: NONREACTIVE

## 2012-02-18 LAB — GROUP B STREP BY PCR: Group B strep by PCR: POSITIVE — AB

## 2012-02-18 MED ORDER — ACETAMINOPHEN 325 MG PO TABS: 650.0000 mg | ORAL_TABLET | ORAL | Status: DC | PRN

## 2012-02-18 MED ORDER — CITRIC ACID-SODIUM CITRATE 334-500 MG/5ML PO SOLN: 30.0000 mL | ORAL | Status: DC | PRN

## 2012-02-18 MED ORDER — LACTATED RINGERS IV SOLN: 500.0000 mL | INTRAVENOUS | Status: DC | PRN

## 2012-02-18 MED ORDER — LACTATED RINGERS IV SOLN
INTRAVENOUS | Status: DC
  Administered 2012-02-18 – 2012-02-19 (×6): via INTRAVENOUS

## 2012-02-18 MED ORDER — OXYCODONE-ACETAMINOPHEN 5-325 MG PO TABS: 1.0000 | ORAL_TABLET | ORAL | Status: DC | PRN

## 2012-02-18 MED ORDER — ONDANSETRON HCL 4 MG/2ML IJ SOLN: 4.0000 mg | Freq: Four times a day (QID) | INTRAMUSCULAR | Status: DC | PRN

## 2012-02-18 MED ORDER — LIDOCAINE HCL (PF) 1 % IJ SOLN
30.0000 mL | INTRAMUSCULAR | Status: DC | PRN
  Administered 2012-02-19: 30 mL via SUBCUTANEOUS
  Filled 2012-02-18: qty 30

## 2012-02-18 MED ORDER — ALUM & MAG HYDROXIDE-SIMETH 200-200-20 MG/5ML PO SUSP
15.0000 mL | Freq: Once | ORAL | Status: AC
  Administered 2012-02-18: 15 mL via ORAL
  Filled 2012-02-18: qty 30

## 2012-02-18 MED ORDER — OXYTOCIN BOLUS FROM INFUSION: 500.0000 mL | INTRAVENOUS | Status: DC

## 2012-02-18 MED ORDER — IBUPROFEN 600 MG PO TABS: 600.0000 mg | ORAL_TABLET | Freq: Four times a day (QID) | ORAL | Status: DC | PRN

## 2012-02-18 MED ORDER — OXYTOCIN 40 UNITS IN LACTATED RINGERS INFUSION - SIMPLE MED
62.5000 mL/h | INTRAVENOUS | Status: DC
  Filled 2012-02-18: qty 1000

## 2012-02-18 NOTE — MAU Provider Note (Signed)
Chief Complaint:  Rupture of Membranes   First Provider Initiated Contact with Patient 02/18/12 2105      HPI: [name] [name] is a 26 y.o. G2P0101 at [redacted]w[redacted]d who presents to maternity admissions reporting large gush of clear fluid at 2000 tonight. Still leaking.  Denies contractions or vaginal bleeding. Good fetal movement.   Pregnancy Course: NPC. Dated by early scan here. UDS pos for cocaine during the pregnancy, but most recently was negative.  Past Medical History: Past Medical History  Diagnosis Date  . No pertinent past medical history   . Pregnancy   . Substance abuse     Past obstetric history: OB History    Grav Para Term Preterm Abortions TAB SAB Ect Mult Living   2 1 0 1 0 0 0 0 0 1      # Outc Date GA Lbr Len/2nd Wgt Sex Del Anes PTL Lv   1 PRE 12/04   2lb7oz(1.106kg) M SVD None Yes Yes   Comments: Child with Sister; Pt does not have custody; preterm due to drug abuse   2 CUR             NSVD "3 months early", 2 months stay in NICU  Past Surgical History: Past Surgical History  Procedure Date  . No past surgeries     Family History: Family History  Problem Relation Age of Onset  . Alcohol abuse Neg Hx   . Arthritis Neg Hx   . Asthma Neg Hx   . Birth defects Neg Hx   . Cancer Neg Hx   . Depression Neg Hx   . COPD Neg Hx   . Diabetes Neg Hx   . Early death Neg Hx   . Drug abuse Neg Hx   . Hearing loss Neg Hx   . Heart disease Neg Hx   . Hyperlipidemia Neg Hx   . Hypertension Neg Hx   . Kidney disease Neg Hx   . Learning disabilities Neg Hx   . Mental illness Neg Hx   . Mental retardation Neg Hx   . Miscarriages / Stillbirths Neg Hx   . Stroke Neg Hx   . Vision loss Neg Hx     Social History: History  Substance Use Topics  . Smoking status: Current Every Day Smoker -- 0.5 packs/day  . Smokeless tobacco: Not on file  . Alcohol Use: 3.0 oz/week    5 Cans of beer per week    Allergies: No Known Allergies  Meds:  Prescriptions prior to  admission  Medication Sig Dispense Refill  . acetaminophen (TYLENOL) 325 MG tablet Take 650 mg by mouth every 6 (six) hours as needed. For pain      . calcium carbonate (TUMS - DOSED IN MG ELEMENTAL CALCIUM) 500 MG chewable tablet Chew 1 tablet by mouth daily as needed. Heart burn        ROS: Pertinent findings in history of present illness.  Physical Exam  Blood pressure 125/79, pulse 80, temperature 98.2 F (36.8 C), temperature source Oral, resp. rate 20, last menstrual period 06/09/2011. GENERAL: Well-developed, well-nourished female in no acute distress.  HEENT: normocephalic HEART: normal rate RESP: normal effort ABDOMEN: Soft, non-tender, gravid appropriate for gestational age EXTREMITIES: Nontender, no edema NEURO: alert and oriented  Dilation: Fingertip Effacement (%): 90 Cervical Position: Posterior Station: -1 Presentation: Vertex Exam by:: D. Vlad Mayberry CNM Large amount of clear AF  FHT:  Baseline 125-130 , moderate variability, accelerations present, no decelerations Contractions: rare, mild  Labs: Results for orders placed during the hospital encounter of 02/18/12 (from the past 24 hour(s))  POCT FERN TEST     Status: Normal   Collection Time   02/18/12  9:19 PM      Component Value Range   POCT Fern Test Positive = ruptured amniotic membanes    CBG 87  Imaging:  Bedside US> cephalic  MAU Course:   Assessment: 1. PROM (premature rupture of membranes)   G2P0101 at [redacted]w[redacted]d [facility] Substance abuse Reassuring Fetal parameters  Plan: Admit for IOL  Danae Orleans, CNM 02/18/2012 9:29 PM

## 2012-02-18 NOTE — MAU Note (Signed)
Monitors removed for bedside ultrasound.

## 2012-02-18 NOTE — H&P (Signed)
[name] [name] is a 26 y.o. female presenting with ROM at 20:00 tonight. Maternal Medical History:  Reason for admission: Reason for admission: rupture of membranes.  Contractions: Onset was 1-2 hours ago.   Frequency: rare.   Perceived severity is mild.    Fetal activity: Perceived fetal activity is normal.   Last perceived fetal movement was within the past hour.    Prenatal complications: No bleeding.   Prenatal Complications - Diabetes: none.    OB History    Grav Para Term Preterm Abortions TAB SAB Ect Mult Living   2 1 0 1 0 0 0 0 0 1      Past Medical History  Diagnosis Date  . No pertinent past medical history   . Pregnancy   . Substance abuse    Past Surgical History  Procedure Date  . No past surgeries    Family History: family history is negative for Alcohol abuse, and Arthritis, and Asthma, and Birth defects, and Cancer, and Depression, and COPD, and Diabetes, and Early death, and Drug abuse, and Hearing loss, and Heart disease, and Hyperlipidemia, and Hypertension, and Kidney disease, and Learning disabilities, and Mental illness, and Mental retardation, and Miscarriages / Stillbirths, and Stroke, and Vision loss, . Social History:  reports that she has been smoking.  She does not have any smokeless tobacco history on file. She reports that she drinks about 3 ounces of alcohol per week. She reports that she uses illicit drugs ("Crack" cocaine).   Prenatal Transfer Tool  Maternal Diabetes: No Genetic Screening: Declined Maternal Ultrasounds/Referrals: Normal Fetal Ultrasounds or other Referrals:  None Maternal Substance Abuse:  Yes:  Type: Cocaine Significant Maternal Medications:  None Significant Maternal Lab Results:  Lab values include: Group B Strep positive Other Comments:  Patient didn't have any prenatal care  Review of Systems  Constitutional: Negative for fever.  Cardiovascular: Negative for chest pain.  Neurological: Negative for headaches.     Dilation: Fingertip Effacement (%): 90 Station: -1 Exam by:: D. Poe CNM Blood pressure 125/77, pulse 77, temperature 97.9 F (36.6 C), temperature source Oral, resp. rate 18, height 5\' 9"  (1.753 m), weight 98.431 kg (217 lb), last menstrual period 06/09/2011. Maternal Exam:  Uterine Assessment: Contraction strength is mild.  Contraction frequency is rare.   Abdomen: Patient reports no abdominal tenderness. Fetal presentation: vertex  Introitus: Normal vulva. Ferning test: positive.   Cervix: Cervix evaluated by digital exam.     Fetal Exam Fetal Monitor Review: Mode: hand-held doppler probe.   Baseline rate: 125-130.  Variability: moderate (6-25 bpm).   Pattern: accelerations present and no decelerations.    Fetal State Assessment: Category I - tracings are normal.    Dilation: Fingertip  Effacement (%): 90  Cervical Position: Posterior  Station: -1  Presentation: Vertex  Exam by:: D. Poe CNM  Large amount of clear AF  Physical Exam  Prenatal labs: ABO, Rh: --/--/B POS (12/16 2247) Antibody: PENDING (12/16 2247) Rubella:   RPR: NON REACTIVE (06/09 1945)  HBsAg:    HIV:    GBS: Positive (12/16 0000)   Assessment: [name] [name] is a 26 y.o. female presenting with ROM at 20:00 tonight. Fetal Wellbeing: Category I GBS: positive  Plan:  1. Admit to BS  2. Routine L&D orders 3. Analgesia/anesthesia PRN  4. Penicillin 5. Cytotec     [name] 02/19/2012, 12:51 AM

## 2012-02-18 NOTE — MAU Note (Signed)
Water broke around 8pm. Clear fluid. No contractions

## 2012-02-18 NOTE — MAU Provider Note (Signed)
Attestation of Attending Supervision of Advanced Practitioner (CNM/NP): Evaluation and management procedures were performed by the Advanced Practitioner under my supervision and collaboration.  I have reviewed the Advanced Practitioner's note and chart, and I agree with the management and plan.  HARRAWAY-SMITH, Chales Pelissier 10:18 PM    

## 2012-02-19 ENCOUNTER — Encounter (HOSPITAL_COMMUNITY): Payer: Self-pay

## 2012-02-19 ENCOUNTER — Encounter (HOSPITAL_COMMUNITY): Payer: Self-pay | Admitting: Anesthesiology

## 2012-02-19 ENCOUNTER — Inpatient Hospital Stay (HOSPITAL_COMMUNITY): Payer: Medicaid Other | Admitting: Anesthesiology

## 2012-02-19 DIAGNOSIS — O4290 Premature rupture of membranes, unspecified as to length of time between rupture and onset of labor, unspecified weeks of gestation: Secondary | ICD-10-CM

## 2012-02-19 DIAGNOSIS — O99892 Other specified diseases and conditions complicating childbirth: Secondary | ICD-10-CM

## 2012-02-19 DIAGNOSIS — O9989 Other specified diseases and conditions complicating pregnancy, childbirth and the puerperium: Secondary | ICD-10-CM

## 2012-02-19 DIAGNOSIS — O361990 Maternal care for other isoimmunization, unspecified trimester, not applicable or unspecified: Secondary | ICD-10-CM | POA: Diagnosis present

## 2012-02-19 DIAGNOSIS — F1410 Cocaine abuse, uncomplicated: Secondary | ICD-10-CM | POA: Diagnosis present

## 2012-02-19 DIAGNOSIS — O99814 Abnormal glucose complicating childbirth: Secondary | ICD-10-CM

## 2012-02-19 LAB — HEPATITIS B SURFACE ANTIGEN: Hepatitis B Surface Ag: NEGATIVE

## 2012-02-19 LAB — RPR: RPR Ser Ql: NONREACTIVE

## 2012-02-19 LAB — TYPE AND SCREEN: DAT, IgG: NEGATIVE

## 2012-02-19 MED ORDER — BENZOCAINE-MENTHOL 20-0.5 % EX AERO
1.0000 "application " | INHALATION_SPRAY | CUTANEOUS | Status: DC | PRN
  Administered 2012-02-20: 1 via TOPICAL
  Filled 2012-02-19: qty 56

## 2012-02-19 MED ORDER — EPHEDRINE 5 MG/ML INJ: 10.0000 mg | INTRAVENOUS | Status: DC | PRN

## 2012-02-19 MED ORDER — METHYLERGONOVINE MALEATE 0.2 MG PO TABS: 0.2000 mg | ORAL_TABLET | ORAL | Status: DC | PRN

## 2012-02-19 MED ORDER — FENTANYL 2.5 MCG/ML BUPIVACAINE 1/10 % EPIDURAL INFUSION (WH - ANES)
14.0000 mL/h | INTRAMUSCULAR | Status: DC
  Administered 2012-02-19: 14 mL/h via EPIDURAL
  Filled 2012-02-19 (×2): qty 125

## 2012-02-19 MED ORDER — PRENATAL MULTIVITAMIN CH
1.0000 | ORAL_TABLET | Freq: Every day | ORAL | Status: DC
  Administered 2012-02-20 – 2012-02-21 (×2): 1 via ORAL
  Filled 2012-02-19 (×2): qty 1

## 2012-02-19 MED ORDER — DIBUCAINE 1 % RE OINT: 1.0000 "application " | TOPICAL_OINTMENT | RECTAL | Status: DC | PRN

## 2012-02-19 MED ORDER — ONDANSETRON HCL 4 MG PO TABS: 4.0000 mg | ORAL_TABLET | ORAL | Status: DC | PRN

## 2012-02-19 MED ORDER — EPHEDRINE 5 MG/ML INJ
10.0000 mg | INTRAVENOUS | Status: DC | PRN
  Filled 2012-02-19: qty 4

## 2012-02-19 MED ORDER — ONDANSETRON HCL 4 MG/2ML IJ SOLN: 4.0000 mg | INTRAMUSCULAR | Status: DC | PRN

## 2012-02-19 MED ORDER — LACTATED RINGERS IV SOLN: 500.0000 mL | Freq: Once | INTRAVENOUS | Status: DC

## 2012-02-19 MED ORDER — NALBUPHINE HCL 10 MG/ML IJ SOLN: 10.0000 mg | INTRAMUSCULAR | Status: DC | PRN

## 2012-02-19 MED ORDER — OXYTOCIN 40 UNITS IN LACTATED RINGERS INFUSION - SIMPLE MED
1.0000 m[IU]/min | INTRAVENOUS | Status: DC
  Administered 2012-02-19: 2 m[IU]/min via INTRAVENOUS

## 2012-02-19 MED ORDER — PHENYLEPHRINE 40 MCG/ML (10ML) SYRINGE FOR IV PUSH (FOR BLOOD PRESSURE SUPPORT)
80.0000 ug | PREFILLED_SYRINGE | INTRAVENOUS | Status: DC | PRN
  Filled 2012-02-19: qty 5

## 2012-02-19 MED ORDER — DIPHENHYDRAMINE HCL 50 MG/ML IJ SOLN: 12.5000 mg | INTRAMUSCULAR | Status: DC | PRN

## 2012-02-19 MED ORDER — PENICILLIN G POTASSIUM 5000000 UNITS IJ SOLR
2.5000 10*6.[IU] | INTRAVENOUS | Status: DC
  Administered 2012-02-19 (×4): 2.5 10*6.[IU] via INTRAVENOUS
  Filled 2012-02-19 (×7): qty 2.5

## 2012-02-19 MED ORDER — WITCH HAZEL-GLYCERIN EX PADS: 1.0000 "application " | MEDICATED_PAD | CUTANEOUS | Status: DC | PRN

## 2012-02-19 MED ORDER — SIMETHICONE 80 MG PO CHEW: 80.0000 mg | CHEWABLE_TABLET | ORAL | Status: DC | PRN

## 2012-02-19 MED ORDER — PHENYLEPHRINE 40 MCG/ML (10ML) SYRINGE FOR IV PUSH (FOR BLOOD PRESSURE SUPPORT): 80.0000 ug | PREFILLED_SYRINGE | INTRAVENOUS | Status: DC | PRN

## 2012-02-19 MED ORDER — IBUPROFEN 600 MG PO TABS
600.0000 mg | ORAL_TABLET | Freq: Four times a day (QID) | ORAL | Status: DC
  Administered 2012-02-19 – 2012-02-21 (×7): 600 mg via ORAL
  Filled 2012-02-19 (×7): qty 1

## 2012-02-19 MED ORDER — OXYCODONE-ACETAMINOPHEN 5-325 MG PO TABS
1.0000 | ORAL_TABLET | ORAL | Status: DC | PRN
  Administered 2012-02-19: 1 via ORAL
  Filled 2012-02-19: qty 1

## 2012-02-19 MED ORDER — PENICILLIN G POTASSIUM 5000000 UNITS IJ SOLR
5.0000 10*6.[IU] | Freq: Once | INTRAVENOUS | Status: AC
  Administered 2012-02-19: 5 10*6.[IU] via INTRAVENOUS
  Filled 2012-02-19: qty 5

## 2012-02-19 MED ORDER — ZOLPIDEM TARTRATE 5 MG PO TABS: 5.0000 mg | ORAL_TABLET | Freq: Every evening | ORAL | Status: DC | PRN

## 2012-02-19 MED ORDER — TETANUS-DIPHTH-ACELL PERTUSSIS 5-2.5-18.5 LF-MCG/0.5 IM SUSP
0.5000 mL | Freq: Once | INTRAMUSCULAR | Status: AC
  Administered 2012-02-20: 0.5 mL via INTRAMUSCULAR
  Filled 2012-02-19: qty 0.5

## 2012-02-19 MED ORDER — METHYLERGONOVINE MALEATE 0.2 MG/ML IJ SOLN: 0.2000 mg | INTRAMUSCULAR | Status: DC | PRN

## 2012-02-19 MED ORDER — LANOLIN HYDROUS EX OINT: TOPICAL_OINTMENT | CUTANEOUS | Status: DC | PRN

## 2012-02-19 MED ORDER — FENTANYL 2.5 MCG/ML BUPIVACAINE 1/10 % EPIDURAL INFUSION (WH - ANES)
INTRAMUSCULAR | Status: DC | PRN
  Administered 2012-02-19: 14 mL/h via EPIDURAL

## 2012-02-19 MED ORDER — MEASLES, MUMPS & RUBELLA VAC ~~LOC~~ INJ
0.5000 mL | INJECTION | Freq: Once | SUBCUTANEOUS | Status: DC
  Filled 2012-02-19: qty 0.5

## 2012-02-19 MED ORDER — MISOPROSTOL 25 MCG QUARTER TABLET
25.0000 ug | ORAL_TABLET | ORAL | Status: DC | PRN
  Administered 2012-02-19 (×2): 25 ug via VAGINAL
  Filled 2012-02-19 (×2): qty 0.25

## 2012-02-19 MED ORDER — SENNOSIDES-DOCUSATE SODIUM 8.6-50 MG PO TABS: 2.0000 | ORAL_TABLET | Freq: Every day | ORAL | Status: DC

## 2012-02-19 MED ORDER — LIDOCAINE HCL (PF) 1 % IJ SOLN
INTRAMUSCULAR | Status: DC | PRN
  Administered 2012-02-19 (×2): 9 mL

## 2012-02-19 MED ORDER — TERBUTALINE SULFATE 1 MG/ML IJ SOLN: 0.2500 mg | Freq: Once | INTRAMUSCULAR | Status: DC | PRN

## 2012-02-19 MED ORDER — PENICILLIN G POTASSIUM 5000000 UNITS IJ SOLR
2.5000 10*6.[IU] | INTRAVENOUS | Status: DC
  Filled 2012-02-19 (×5): qty 2.5

## 2012-02-19 MED ORDER — NALBUPHINE SYRINGE 5 MG/0.5 ML
10.0000 mg | INJECTION | INTRAMUSCULAR | Status: DC | PRN
  Administered 2012-02-19: 10 mg via INTRAVENOUS
  Filled 2012-02-19 (×2): qty 1

## 2012-02-19 MED ORDER — DIPHENHYDRAMINE HCL 25 MG PO CAPS
25.0000 mg | ORAL_CAPSULE | Freq: Four times a day (QID) | ORAL | Status: DC | PRN
  Administered 2012-02-21: 25 mg via ORAL
  Filled 2012-02-19: qty 1

## 2012-02-19 NOTE — Progress Notes (Signed)
I was present for the exam and agree with above.  [place], CNM 02/19/2012 10:48 PM  

## 2012-02-19 NOTE — H&P (Signed)
I saw and examined patient and agree with above. Marleta Lapierre, MD 

## 2012-02-19 NOTE — Progress Notes (Signed)
S: Doing well, feeling contractions that lasts longer.  Increasing pain.  Planning epidural. Good fetal movement.   OCeasar Mons Vitals:   02/19/12 0605 02/19/12 0721 02/19/12 0813 02/19/12 0900  BP: 109/60 99/47 104/56 125/79  Pulse: 78 72 81 83  Temp:   98.1 F (36.7 C)   TempSrc:   Oral   Resp:  20 20 20   Height:      Weight:         FHT:  FHR: 135 bpm, variability: moderate,  accelerations:  Present,  decelerations:  Absent UC:   irregular, every 3-5 minutes SVE:   Dilation: 3.5 Effacement (%): 70 Station: -3 Exam by:: Raelyn Mora, SNM   A / P: Induction of labor due to gestational diabetes A1 Substance Abuse - cocaine GBS Positive No Prenatal Care  Start Pitocin at 2mU, increase by 2mU Fetal Wellbeing:  Category I Pain Control:  Labor support without medications  Anticipated MOD:  NSVD  Raelyn Mora, SNM 02/19/2012, 10:22 AM Supervised by: Caren Griffins, CNM  PCN for GBS prophylaxis. Evaluation and management procedures were performed by SNM under my supervision/collaboration. Chart reviewed, patient examined by me and I agree with management and plan. Danae Orleans, CNM 02/19/2012 12:10 PM

## 2012-02-19 NOTE — Progress Notes (Addendum)
S: Doing well, pain well-controlled with epidural. (+) pelvic pressure.  Complains of RLQ pain, has not voided in > 2 hours.  Good fetal movement.  O: Filed Vitals:   02/19/12 1731 02/19/12 1801 02/19/12 1831 02/19/12 1901  BP: 112/74 114/69 122/70 123/67  Pulse: 80 63 67 71  Temp:   97.8 F (36.6 C)   TempSrc:   Oral   Resp: 20 20 20 20   Height:      Weight:      SpO2:         FHT:  FHR: 125 bpm, variability: moderate,  accelerations:  Present,  decelerations:  Absent, variables UC:   regular, every 2-3 minutes SVE:   Dilation: Lip/rim Effacement (%): 100 Station: +1 Exam by:: Raelyn Mora, SNM I&O cath, 1250 mL of clear urine in return without difficulty   A / P: Induction of labor due to PROM,  progressing well on pitocin Substance Abuse - cocaine GBS positive Pitocin induction Bladder Distension  Fetal Wellbeing:  Category I Pain Control:  Epidural Antibiotic: PCN I&O cath Anticipated MOD:  NSVD  Raelyn Mora, SNM 02/19/2012, 7:46 PM Supervised by: Dorathy Kinsman, CNM

## 2012-02-19 NOTE — Progress Notes (Signed)
I have seen and examined patient and agree with above.  Twanda Stakes, MD 

## 2012-02-19 NOTE — Progress Notes (Signed)
I was present for the exam and agree with above.  [place], [place] 02/19/2012 10:48 PM

## 2012-02-19 NOTE — Progress Notes (Signed)
I was present for the exam and agree with above.  [place], CNM 02/19/2012 10:12 PM

## 2012-02-19 NOTE — Progress Notes (Signed)
I was present for the exam and agree with above except that pt was not IOL for GDM. She was admitted for PPROM.  [place], CNM 02/19/2012 10:18 PM

## 2012-02-19 NOTE — Progress Notes (Signed)
S: Doing well, increasing pain with contractions.  Epidural requested.  Bloody show.  Good fetal movement.   O: Filed Vitals:   02/19/12 1057 02/19/12 1134 02/19/12 1204 02/19/12 1235  BP: 120/79 128/77 124/68 137/83  Pulse: 75 80 75 87  Temp:  97.8 F (36.6 C)    TempSrc:  Oral    Resp: 20 20 20 20   Height:      Weight:         FHT:  FHR: 120 bpm, variability: moderate,  accelerations:  Present,  decelerations:  Absent UC:   regular, every 2-3 minutes SVE:   Dilation: 4.5 Effacement (%): 90 Station: -2 Exam by:: Rolita CNM Student    A / P: Induction of labor due to gestational diabetes,  progressing well on pitocin Substance Abuse - cocaine GBS Positive Pitocin induction   Fetal Wellbeing:  Category I Pain Control:  [place]             Patient may have epidural Antibiotic: PCN Continue with Pitocin orders  Anticipated MOD:  NSVD  [name] [name], SNM 02/19/2012, 12:52 PM Supervised by: Caren Griffins, CNM  Evaluation and management procedures were performed by SNM under my supervision/collaboration. Chart reviewed, patient examined by me and I agree with management and plan. Danae Orleans, CNM 02/19/2012 1:03 PM

## 2012-02-19 NOTE — Anesthesia Preprocedure Evaluation (Signed)
Anesthesia Evaluation  Patient identified by MRN, date of birth, ID band Patient awake    Reviewed: Allergy & Precautions, H&P , NPO status , Patient's Chart, lab work & pertinent test results  Airway Mallampati: II TM Distance: >3 FB Neck ROM: full    Dental No notable dental hx.    Pulmonary neg pulmonary ROS,    Pulmonary exam normal       Cardiovascular negative cardio ROS      Neuro/Psych negative neurological ROS     GI/Hepatic negative GI ROS, Neg liver ROS,   Endo/Other  negative endocrine ROS  Renal/GU negative Renal ROS  negative genitourinary   Musculoskeletal negative musculoskeletal ROS (+)   Abdominal Normal abdominal exam  (+)   Peds negative pediatric ROS (+)  Hematology negative hematology ROS (+)   Anesthesia Other Findings   Reproductive/Obstetrics (+) Pregnancy                           Anesthesia Physical Anesthesia Plan  ASA: II  Anesthesia Plan: Epidural   Post-op Pain Management:    Induction:   Airway Management Planned:   Additional Equipment:   Intra-op Plan:   Post-operative Plan:   Informed Consent: I have reviewed the patients History and Physical, chart, labs and discussed the procedure including the risks, benefits and alternatives for the proposed anesthesia with the patient or authorized representative who has indicated his/her understanding and acceptance.     Plan Discussed with:   Anesthesia Plan Comments:         Anesthesia Quick Evaluation  

## 2012-02-19 NOTE — Anesthesia Procedure Notes (Signed)
Epidural Patient location during procedure: OB Start time: 02/19/2012 1:54 PM End time: 02/19/2012 1:58 PM  Staffing Anesthesiologist: Sandrea Hughs  Preanesthetic Checklist Completed: patient identified, site marked, surgical consent, pre-op evaluation, timeout performed, IV checked, risks and benefits discussed and monitors and equipment checked  Epidural Patient position: sitting Prep: site prepped and draped and DuraPrep Patient monitoring: continuous pulse ox and blood pressure Approach: midline Injection technique: LOR air  Needle:  Needle type: Tuohy  Needle gauge: 17 G Needle length: 9 cm and 9 Needle insertion depth: 5 cm cm Catheter type: closed end flexible Catheter size: 19 Gauge Catheter at skin depth: 10 cm Test dose: negative and Other  Assessment Sensory level: T9 Events: blood not aspirated, injection not painful, no injection resistance, negative IV test and no paresthesia  Additional Notes Reason for block:procedure for pain

## 2012-02-19 NOTE — Progress Notes (Signed)
CSW met with pt briefly & provided her with drug treatment information, as requested by pt.  This CSW is familiar with pt and history from previous MAU visit.  CSW provided pt with application for [facility] and other treatment facilities located in [place].  Pt is accompanied by FOB, who denies substance abuse history.  According to him, he was recently released from prison & trying to reestablish himself.  He is not in favor of pt going to an in-patient program, as he does not think it is necessary.  Pt and FOB plan to discuss options.  CSW was realistic with pt about CPS involvement &  her chance of parenting this child.  CSW encouraged her to follow through with treatment.  CSW will follow up to complete full assessment and facilitate safe discharge of the infant.

## 2012-02-19 NOTE — Progress Notes (Signed)
[name] [name] is a 26 y.o. G2P0101 at [redacted]w[redacted]d by ultrasound admitted for PROM  Subjective: Pt is comfortable, not in pain, good fetal movement.  Objective: BP 125/77  Pulse 77  Temp 97.9 F (36.6 C) (Oral)  Resp 18  Ht 5\' 9"  (1.753 m)  Wt 98.431 kg (217 lb)  BMI 32.05 kg/m2  LMP 06/09/2011      FHT:  FHR: 140 bpm, variability: moderate,  accelerations:  Present,  decelerations:  Absent UC:   none SVE:   Dilation: Fingertip Effacement (%): 50 Station: -1 Exam by:: [name] Organ, MD Labs: Lab Results  Component Value Date   WBC 15.5* 02/18/2012   HGB 12.1 02/18/2012   HCT 33.8* 02/18/2012   MCV 85.4 02/18/2012   PLT 316 02/18/2012    Assessment / Plan: Spontaneous labor, progressing normally  Labor: Progressing normally Preeclampsia:  no signs or symptoms of toxicity Fetal Wellbeing:  Category I Pain Control:  Labor support without medications I/D:  n/a Anticipated MOD:  NSVD Cytotec  [name] [name] 02/19/2012, 1:26 AM

## 2012-02-19 NOTE — Progress Notes (Signed)
S: Doing well, pain well-controlled with epidural.  Resting off and on. Small amount of pink-tinged fluid.  Bloody show.  Good fetal movement.   OCeasar Mons Vitals:   02/19/12 1501 02/19/12 1531 02/19/12 1601 02/19/12 1612  BP: 121/76 120/75 127/71 128/71  Pulse: 79 83 73 65  Temp: 97.7 F (36.5 C)     TempSrc: Oral     Resp: 20 20  20   Height:      Weight:      SpO2:         FHT:  FHR: 120 bpm, variability: moderate,  accelerations:  Present,  decelerations:  Absent UC:   regular, every 2-3.5 minutes SVE:   Dilation: 6 Effacement (%): 70 Station: -1 Exam by:: Raelyn Mora, SNM    A / P: Induction of labor due to PROM, progressing well on pitocin  Substance Abuse - cocaine  GBS Positive  Pitocin induction   Fetal Wellbeing: Category I  Pain Control: Nubain  Patient may have epidural  Antibiotic: PCN  Continue with Pitocin orders  Anticipated MOD: NSVD  Raelyn Mora, SNM 02/19/2012, 4:16 PM Supervised by: Caren Griffins, CNM Evaluation and management procedures were performed by SNM under my supervision/collaboration. Chart reviewed, patient examined by me and I agree with management and plan.

## 2012-02-19 NOTE — Progress Notes (Addendum)
S: Doing well, pain well-controlled with epidural.  Small amount of pink-tinged fluid.  Bloody show.  Good fetal movement.   O: Filed Vitals:   02/19/12 1612 02/19/12 1631 02/19/12 1701 02/19/12 1731  BP: 128/71 124/79 117/63 112/74  Pulse: 65 84 68 80  Temp:      TempSrc:      Resp: 20 20 20 20   Height:      Weight:      SpO2:         FHT:  FHR: 125 bpm, variability: moderate,  accelerations:  Present,  decelerations:  Absent, variables with every contraction UC:   Difficult to trace, palpating every 2-3 minutes SVE:   Dilation: 8 Effacement (%): 80 Station: 0 Exam by:: [name] [name] SNM    A / P: Induction of labor due to PROM,  progressing well on pitocin Substance Abuse - cocaine GBS Positive Pitocin induction  Fetal Wellbeing:  Category I Pain Control:  Epidural Antibiotic: PCN Continue Pitocin orders Anticipated MOD:  NSVD  [name] [name], SNM 02/19/2012, 6:05 PM Supervised by: Dorathy Kinsman, CNM

## 2012-02-19 NOTE — Progress Notes (Signed)
[name] [name] is a 26 y.o. G2P0101 at [redacted]w[redacted]d by LMP admitted for PROM  Subjective:   Objective: BP 109/60  Pulse 78  Temp 97.6 F (36.4 C) (Oral)  Resp 18  Ht 5\' 9"  (1.753 m)  Wt 98.431 kg (217 lb)  BMI 32.05 kg/m2  LMP 06/09/2011      FHT:  FHR: 130 bpm, variability: moderate,  accelerations:  Present,  decelerations:  Absent UC:   rare SVE:   Dilation: Fingertip Effacement (%): 50 Station: -3 Exam by:: Dr. Chelsea [name]  Labs: Lab Results  Component Value Date   WBC 15.5* 02/18/2012   HGB 12.1 02/18/2012   HCT 33.8* 02/18/2012   MCV 85.4 02/18/2012   PLT 316 02/18/2012    Assessment / Plan: PROM  Labor: Progressing normally Preeclampsia:  no signs or symptoms of toxicity Fetal Wellbeing:  Category I Pain Control:  Labor support without medications I/D:  n/a Anticipated MOD:  NSVD  [name] [name] 02/19/2012, 7:06 AM

## 2012-02-20 ENCOUNTER — Encounter (HOSPITAL_COMMUNITY): Payer: Self-pay | Admitting: *Deleted

## 2012-02-20 LAB — CBC
Hemoglobin: 11 g/dL — ABNORMAL LOW (ref 12.0–15.0)
Platelets: 271 10*3/uL (ref 150–400)
RBC: 3.62 MIL/uL — ABNORMAL LOW (ref 3.87–5.11)
WBC: 15.3 10*3/uL — ABNORMAL HIGH (ref 4.0–10.5)

## 2012-02-20 MED ORDER — MEASLES, MUMPS & RUBELLA VAC ~~LOC~~ INJ
0.5000 mL | INJECTION | Freq: Once | SUBCUTANEOUS | Status: AC
  Administered 2012-02-21: 0.5 mL via SUBCUTANEOUS
  Filled 2012-02-20 (×2): qty 0.5

## 2012-02-20 NOTE — Anesthesia Postprocedure Evaluation (Signed)
Anesthesia Post Note  Patient: [name] [name]  Procedure(s) Performed: * No procedures listed *  Anesthesia type: Epidural  Patient location: Mother/Baby  Post pain: Pain level controlled  Post assessment: Post-op Vital signs reviewed  Last Vitals:  Filed Vitals:   02/20/12 0520  BP: 107/70  Pulse: 69  Temp: 36.4 C  Resp: 18    Post vital signs: Reviewed  Level of consciousness: awake  Complications: No apparent anesthesia complications

## 2012-02-20 NOTE — Progress Notes (Signed)
UR chart review completed.  

## 2012-02-20 NOTE — Progress Notes (Signed)
[facility] CPS is here to visit with MOB to discuss appropriate d/c arrangements for the baby- they will advise as a plan is confirmed- this may lead to a Team Decision Making meeting via DSS staff prior to baby being released. CSW to update as we hear back from CPS worker- Pincus Large [phone]. Reece Levy, MSW, Theresia Majors [phone]

## 2012-02-20 NOTE — Anesthesia Postprocedure Evaluation (Signed)
  Anesthesia Post-op Note  Patient: [name] [name]  Procedure(s) Performed: * No procedures listed *  Patient Location: Mother/Baby  Anesthesia Type:Epidural  Level of Consciousness: awake  Airway and Oxygen Therapy: Patient Spontanous Breathing  Post-op Pain: none  Post-op Assessment: Patient's Cardiovascular Status Stable, Respiratory Function Stable, Patent Airway, No signs of Nausea or vomiting, Adequate PO intake, Pain level controlled, No headache, No backache, No residual numbness and No residual motor weakness  Post-op Vital Signs: Reviewed and stable  Complications: No apparent anesthesia complications

## 2012-02-20 NOTE — Addendum Note (Signed)
Addendum  created 02/20/12 1321 by Suella Grove, CRNA   Modules edited:Charges VN, Notes Section

## 2012-02-20 NOTE — Progress Notes (Signed)
PPD #1 SVD  S:  Reports feeling good             Tolerating po/ No nausea or vomiting             Bleeding is light             Pain controlled withnarcotic analgesics including percocet             Up ad lib / ambulatory  Newborn bottle feeding  / Circumcision - desires to wait until Medicaid established for baby   O:               VS: BP 107/70  Pulse 69  Temp 97.5 F (36.4 C) (Oral)  Resp 18  Ht 5\' 9"  (1.753 m)  Wt 98.431 kg (217 lb)  BMI 32.05 kg/m2  SpO2 99%  LMP 06/09/2011     LABS:  Basename 02/20/12 0530 02/18/12 2141  WBC 15.3* 15.5*  HGB 11.0* 12.1  PLT 271 316                Physical Exam:             Alert and oriented X3  Lungs: Clear and unlabored  Heart: regular rate and rhythm / no mumurs  Abdomen: soft, non-tender, non-distended              Fundus: firm, non-tender, U-1  Perineum: intact  Labial repair healing, no edema or erythema  Lochia: light to moderate  Extremities: no edema, no calf pain or tenderness    A: PPD # 1  PROM x 24 hours  IOL for PROM @ 35.2 weeks  Preterm - delivered  Substance Abuse - cocaine   GBS Positive   Doing well - stable status  P:  Routine post partum orders  Social work consult   Desires OCP  Anticipate discharge home tomorrow, if peds discharges baby  Raelyn Mora, SNM 02/20/2012, 7:58 AM Supervised by: Philipp Deputy, CNM

## 2012-02-20 NOTE — Progress Notes (Signed)
Clinical Social Work Department PSYCHOSOCIAL ASSESSMENT - MATERNAL/CHILD 02/20/2012  Patient:  [name] [name], [name] [name]  Account Number:  [id number]  Admit Date:  02/18/2012  Marjo Bicker Name:   [name] Mainland "CJ"    Clinical Social Worker:  Reece Levy, [place]   Date/Time:  02/20/2012 10:54 AM  Date Referred:  02/20/2012   Referral source  Physician     Referred reason  Psychosocial assessment  Substance Abuse   Other referral source:    I:  FAMILY / HOME ENVIRONMENT Child's legal guardian:  PARENT  Guardian - Name Guardian - Age Guardian - Address  [name] [name] [address] [place], [place] [address]   Other household support members/support persons Name Relationship DOB  Diane Washington FRIEND [phone]   Other support:   uncertain    II  PSYCHOSOCIAL DATA Information Source:  Patient Interview  Surveyor, quantity and Community Resources Employment:   Never worked   Surveyor, quantity resources:  [employer] If [employer] - County:  [employer]  School / Grade:  7 Maternity Gaffer / Child Company secretary / Early Interventions:   WIC  [employer]  Cultural issues impacting care:   recent homelessness    III  STRENGTHS Strengths  Supportive family/friends   Strength comment:  Patient voices a strong interest and desire to keep the baby- she is hopeful that the child can go home with her but understands this will be CPS's decision.   IV  RISK FACTORS AND CURRENT PROBLEMS Current Problem:  YES   Risk Factor & Current Problem Patient Issue Family Issue Risk Factor / Current Problem Comment  Basic Needs (food,housing,etc) Y Y has been homeless in recent past  DSS Involvement Y Y CPS referral made/ hx CPS with 1st born in 2004  Mental Illness Y Y MOB with hx Bipolar d/o and PTSD  Substance Abuse Y Y cocaine use by MOB @ 1 wk ago    V  SOCIAL WORK ASSESSMENT MOB found holding baby upon visit- she tells me she has been living with her friend Diane for about 1.5 months-  prior to that she was homeless- The FOB was recently released from jail- he has been staying with her at Diane's and is looking for work- The MOB states her sister has custody og her 8yo son due to CPS involvment- they reside in [place] and she sees him regularly per her report.  MOB admits to drug use and is interested in drug treatment- she has completed application for [employer]-      VI SOCIAL WORK PLAN Social Work Plan  Other  Child Protective Services Report   Type of pt/family education:   If child protective services report - county:  [place] If child protective services report - date:  02/20/2012 Information/referral to community resources comment:   [employer]   Other social work plan:   CPS referral made and we are awaiting their visit and determination-  Updated RN and MD as well as patient of this-    CPS to visit patient and update this CSW- will advise.   Reece Levy, MSW, Theresia Majors [phone]

## 2012-02-20 NOTE — Addendum Note (Signed)
Addendum  created 02/20/12 1321 by Shanta Dorvil C Keedan Sample, CRNA   Modules edited:Charges VN, Notes Section    

## 2012-02-20 NOTE — Plan of Care (Signed)
Problem: Discharge Progression Outcomes Goal: MMR given as ordered Outcome: Not Met (add Reason) Needs MMR ( non immune)     

## 2012-02-20 NOTE — Progress Notes (Signed)
I have seen and examined this patient and I agree with the above. [name] [name] 8:54 AM 02/20/2012

## 2012-02-21 MED ORDER — PRENATAL MULTIVITAMIN CH: 1.0000 | ORAL_TABLET | Freq: Every day | ORAL | Status: DC

## 2012-02-21 MED ORDER — IBUPROFEN 600 MG PO TABS: 600.0000 mg | ORAL_TABLET | Freq: Four times a day (QID) | ORAL | Status: DC

## 2012-02-21 MED ORDER — INFLUENZA VIRUS VACC SPLIT PF IM SUSP
0.5000 mL | Freq: Once | INTRAMUSCULAR | Status: AC
  Administered 2012-02-21: 0.5 mL via INTRAMUSCULAR
  Filled 2012-02-21: qty 0.5

## 2012-02-21 NOTE — Discharge Summary (Signed)
Seen and agree with note Masaye Gatchalian CNM 

## 2012-02-21 NOTE — Clinical Social Work Note (Signed)
CSW secured classroom 4 for TDM at 10am on 02/22/12. CPS worker is not sure if there will be a fostercare placement or a kinship placement.   CSW to follow and assist at TDM.   Doreen Salvage, LCSW  Coverning NICU for [facility] M-F 8am-12pm

## 2012-02-21 NOTE — Discharge Summary (Addendum)
Obstetric Discharge Summary Reason for Admission: onset of labor Prenatal Procedures: none Intrapartum Procedures: spontaneous vaginal delivery Postpartum Procedures: none Complications-Operative and Postpartum: 1st degree laceration, labia minora Hemoglobin  Date Value Range Status  02/20/2012 11.0* 12.0 - 15.0 g/dL Final     HCT  Date Value Range Status  02/20/2012 30.8* 36.0 - 46.0 % Final    Physical Exam:  General: alert, cooperative and no distress Lochia: appropriate Uterine Fundus: firm DVT Evaluation: No evidence of DVT seen on physical exam.  Discharge Diagnoses: Term Pregnancy-delivered  Discharge Information: Date: 02/21/2012 Activity: pelvic rest Diet: routine Medications: Ibuprofen Condition: stable Instructions: refer to practice specific booklet Discharge to: home   Newborn Data: Live born female  Birth Weight: 4 lb 11.7 oz (2146 g) APGAR: 9, 9  Home with Waiting DSS disposition. Pt will get pills for birth control Bottle feeding. Pt desire circumcision outside hospital   [name] 02/21/2012, 6:57 AM

## 2012-03-19 ENCOUNTER — Encounter: Payer: Self-pay | Admitting: Advanced Practice Midwife

## 2012-04-02 ENCOUNTER — Ambulatory Visit: Payer: Self-pay | Admitting: Family Medicine

## 2013-04-29 ENCOUNTER — Emergency Department (HOSPITAL_COMMUNITY)
Admission: EM | Admit: 2013-04-29 | Discharge: 2013-04-30 | Disposition: A | Discharge status: Other Acute Inpatient Hospital | Payer: Self-pay | Attending: Emergency Medicine | Admitting: Emergency Medicine

## 2013-04-29 ENCOUNTER — Encounter (HOSPITAL_COMMUNITY): Payer: Self-pay | Admitting: Emergency Medicine

## 2013-04-29 DIAGNOSIS — S0083XA Contusion of other part of head, initial encounter: Secondary | ICD-10-CM

## 2013-04-29 DIAGNOSIS — S058X9A Other injuries of unspecified eye and orbit, initial encounter: Secondary | ICD-10-CM | POA: Insufficient documentation

## 2013-04-29 DIAGNOSIS — Z79899 Other long term (current) drug therapy: Secondary | ICD-10-CM | POA: Insufficient documentation

## 2013-04-29 DIAGNOSIS — S0003XA Contusion of scalp, initial encounter: Secondary | ICD-10-CM | POA: Insufficient documentation

## 2013-04-29 DIAGNOSIS — S1093XA Contusion of unspecified part of neck, initial encounter: Principal | ICD-10-CM

## 2013-04-29 DIAGNOSIS — F17200 Nicotine dependence, unspecified, uncomplicated: Secondary | ICD-10-CM | POA: Insufficient documentation

## 2013-04-29 DIAGNOSIS — F1010 Alcohol abuse, uncomplicated: Secondary | ICD-10-CM | POA: Insufficient documentation

## 2013-04-29 DIAGNOSIS — F1410 Cocaine abuse, uncomplicated: Secondary | ICD-10-CM | POA: Insufficient documentation

## 2013-04-29 DIAGNOSIS — S0502XA Injury of conjunctiva and corneal abrasion without foreign body, left eye, initial encounter: Secondary | ICD-10-CM
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
## 2013-04-30 ENCOUNTER — Encounter (HOSPITAL_COMMUNITY): Payer: Self-pay | Admitting: *Deleted

## 2013-04-30 ENCOUNTER — Inpatient Hospital Stay (HOSPITAL_COMMUNITY)
Admission: AD | Admit: 2013-04-30 | Discharge: 2013-05-06 | DRG: 897 | Disposition: A | Discharge status: Other Acute Inpatient Hospital | Payer: Federal, State, Local not specified - Other | Source: Intra-hospital | Attending: Emergency Medicine | Admitting: Emergency Medicine

## 2013-04-30 ENCOUNTER — Emergency Department (HOSPITAL_COMMUNITY): Payer: Medicaid Other

## 2013-04-30 DIAGNOSIS — F1010 Alcohol abuse, uncomplicated: Secondary | ICD-10-CM | POA: Diagnosis present

## 2013-04-30 DIAGNOSIS — K089 Disorder of teeth and supporting structures, unspecified: Secondary | ICD-10-CM | POA: Diagnosis present

## 2013-04-30 DIAGNOSIS — F1420 Cocaine dependence, uncomplicated: Secondary | ICD-10-CM | POA: Diagnosis present

## 2013-04-30 DIAGNOSIS — F1020 Alcohol dependence, uncomplicated: Principal | ICD-10-CM | POA: Diagnosis present

## 2013-04-30 DIAGNOSIS — F4310 Post-traumatic stress disorder, unspecified: Secondary | ICD-10-CM | POA: Diagnosis present

## 2013-04-30 DIAGNOSIS — Z79899 Other long term (current) drug therapy: Secondary | ICD-10-CM

## 2013-04-30 DIAGNOSIS — K0889 Other specified disorders of teeth and supporting structures: Secondary | ICD-10-CM

## 2013-04-30 DIAGNOSIS — O99320 Drug use complicating pregnancy, unspecified trimester: Secondary | ICD-10-CM

## 2013-04-30 DIAGNOSIS — F1410 Cocaine abuse, uncomplicated: Secondary | ICD-10-CM | POA: Diagnosis present

## 2013-04-30 HISTORY — DX: Major depressive disorder, single episode, unspecified: F32.9

## 2013-04-30 HISTORY — DX: Depression, unspecified: F32.A

## 2013-04-30 HISTORY — DX: Mental disorder, not otherwise specified: F99

## 2013-04-30 HISTORY — DX: Anxiety disorder, unspecified: F41.9

## 2013-04-30 LAB — I-STAT CHEM 8, ED
BUN: 6 mg/dL (ref 6–23)
CREATININE: 1.1 mg/dL (ref 0.50–1.10)
Calcium, Ion: 1.1 mmol/L — ABNORMAL LOW (ref 1.12–1.23)
Chloride: 104 mEq/L (ref 96–112)
Glucose, Bld: 99 mg/dL (ref 70–99)
HCT: 44 % (ref 36.0–46.0)
Hemoglobin: 15 g/dL (ref 12.0–15.0)
POTASSIUM: 3.4 meq/L — AB (ref 3.7–5.3)
Sodium: 139 mEq/L (ref 137–147)
TCO2: 20 mmol/L (ref 0–100)

## 2013-04-30 LAB — ETHANOL: Alcohol, Ethyl (B): 230 mg/dL — ABNORMAL HIGH (ref 0–11)

## 2013-04-30 MED ORDER — POLYMYXIN B-TRIMETHOPRIM 10000-0.1 UNIT/ML-% OP SOLN
2.0000 [drp] | OPHTHALMIC | Status: DC
Start: 2013-04-30 — End: 2013-04-30
  Administered 2013-04-30 (×2): 2 [drp] via OPHTHALMIC

## 2013-04-30 MED ORDER — HYDROCODONE-ACETAMINOPHEN 5-325 MG PO TABS
1.0000 | ORAL_TABLET | Freq: Once | ORAL | Status: AC
Start: 2013-04-30 — End: 2013-04-30
  Administered 2013-04-30: 1 via ORAL
  Filled 2013-04-30: qty 1

## 2013-04-30 MED ORDER — POLYMYXIN B-TRIMETHOPRIM 10000-0.1 UNIT/ML-% OP SOLN
2.0000 [drp] | OPHTHALMIC | Status: DC
  Administered 2013-04-30 – 2013-05-05 (×22): 2 [drp] via OPHTHALMIC
  Filled 2013-04-30: qty 10

## 2013-04-30 MED ORDER — IBUPROFEN 600 MG PO TABS
600.0000 mg | ORAL_TABLET | Freq: Four times a day (QID) | ORAL | Status: DC | PRN
  Administered 2013-04-30 – 2013-05-06 (×9): 600 mg via ORAL
  Filled 2013-04-30 (×9): qty 1

## 2013-04-30 MED ORDER — TRAZODONE HCL 50 MG PO TABS
50.0000 mg | ORAL_TABLET | Freq: Every evening | ORAL | Status: DC | PRN
Start: 2013-04-30 — End: 2013-05-06
  Administered 2013-05-01 – 2013-05-05 (×4): 50 mg via ORAL
  Filled 2013-04-30: qty 14
  Filled 2013-04-30 (×6): qty 1

## 2013-04-30 MED ORDER — POLYMYXIN B-TRIMETHOPRIM 10000-0.1 UNIT/ML-% OP SOLN
2.0000 [drp] | Freq: Once | OPHTHALMIC | Status: AC
  Administered 2013-04-30: 2 [drp] via OPHTHALMIC
  Filled 2013-04-30: qty 10

## 2013-04-30 MED ORDER — CHLORDIAZEPOXIDE HCL 25 MG PO CAPS
25.0000 mg | ORAL_CAPSULE | ORAL | Status: AC
  Administered 2013-05-03 – 2013-05-04 (×2): 25 mg via ORAL
  Filled 2013-04-30 (×2): qty 1

## 2013-04-30 MED ORDER — SODIUM CHLORIDE 0.9 % IV BOLUS (SEPSIS)
1000.0000 mL | Freq: Once | INTRAVENOUS | Status: AC
  Administered 2013-04-30: 1000 mL via INTRAVENOUS

## 2013-04-30 MED ORDER — ACETAMINOPHEN 325 MG PO TABS
650.0000 mg | ORAL_TABLET | Freq: Once | ORAL | Status: AC
  Administered 2013-04-30: 650 mg via ORAL
  Filled 2013-04-30: qty 2

## 2013-04-30 MED ORDER — CHLORDIAZEPOXIDE HCL 25 MG PO CAPS: 25.0000 mg | ORAL_CAPSULE | Freq: Four times a day (QID) | ORAL | Status: AC | PRN

## 2013-04-30 MED ORDER — CHLORDIAZEPOXIDE HCL 25 MG PO CAPS
25.0000 mg | ORAL_CAPSULE | Freq: Three times a day (TID) | ORAL | Status: AC
  Administered 2013-05-02 – 2013-05-03 (×3): 25 mg via ORAL
  Filled 2013-04-30 (×3): qty 1

## 2013-04-30 MED ORDER — IBUPROFEN 800 MG PO TABS
800.0000 mg | ORAL_TABLET | Freq: Once | ORAL | Status: AC
  Administered 2013-04-30: 800 mg via ORAL
  Filled 2013-04-30: qty 1

## 2013-04-30 MED ORDER — ACETAMINOPHEN 325 MG PO TABS
650.0000 mg | ORAL_TABLET | Freq: Four times a day (QID) | ORAL | Status: DC | PRN
  Administered 2013-04-30 – 2013-05-04 (×8): 650 mg via ORAL
  Filled 2013-04-30 (×8): qty 2

## 2013-04-30 MED ORDER — THIAMINE HCL 100 MG/ML IJ SOLN
100.0000 mg | Freq: Once | INTRAMUSCULAR | Status: AC
  Administered 2013-04-30: 100 mg via INTRAMUSCULAR
  Filled 2013-04-30: qty 2

## 2013-04-30 MED ORDER — MAGNESIUM HYDROXIDE 400 MG/5ML PO SUSP: 30.0000 mL | Freq: Every day | ORAL | Status: DC | PRN

## 2013-04-30 MED ORDER — CITALOPRAM HYDROBROMIDE 10 MG PO TABS
10.0000 mg | ORAL_TABLET | Freq: Every day | ORAL | Status: DC
  Administered 2013-04-30 – 2013-05-03 (×4): 10 mg via ORAL
  Filled 2013-04-30 (×7): qty 1

## 2013-04-30 MED ORDER — ONDANSETRON 4 MG PO TBDP: 4.0000 mg | ORAL_TABLET | Freq: Four times a day (QID) | ORAL | Status: AC | PRN

## 2013-04-30 MED ORDER — HYDROXYZINE HCL 25 MG PO TABS: 25.0000 mg | ORAL_TABLET | Freq: Four times a day (QID) | ORAL | Status: AC | PRN

## 2013-04-30 MED ORDER — TETRACAINE HCL 0.5 % OP SOLN
1.0000 [drp] | Freq: Once | OPHTHALMIC | Status: AC
  Administered 2013-04-30: 1 [drp] via OPHTHALMIC
  Filled 2013-04-30: qty 2

## 2013-04-30 MED ORDER — ADULT MULTIVITAMIN W/MINERALS CH
1.0000 | ORAL_TABLET | Freq: Every day | ORAL | Status: DC
  Administered 2013-04-30 – 2013-05-05 (×6): 1 via ORAL
  Filled 2013-04-30 (×12): qty 1

## 2013-04-30 MED ORDER — FLUORESCEIN SODIUM 1 MG OP STRP
1.0000 | ORAL_STRIP | Freq: Once | OPHTHALMIC | Status: AC
  Administered 2013-04-30: 1 via OPHTHALMIC
  Filled 2013-04-30: qty 1

## 2013-04-30 MED ORDER — BENZOCAINE 10 % MT GEL
Freq: Four times a day (QID) | OROMUCOSAL | Status: DC | PRN
  Administered 2013-05-01 – 2013-05-04 (×5): via OROMUCOSAL
  Filled 2013-04-30 (×2): qty 9.4

## 2013-04-30 MED ORDER — VITAMIN B-1 100 MG PO TABS
100.0000 mg | ORAL_TABLET | Freq: Every day | ORAL | Status: DC
  Administered 2013-05-01 – 2013-05-04 (×4): 100 mg via ORAL
  Administered 2013-05-05: 08:00:00 via ORAL
  Filled 2013-04-30 (×10): qty 1

## 2013-04-30 MED ORDER — CHLORDIAZEPOXIDE HCL 25 MG PO CAPS
25.0000 mg | ORAL_CAPSULE | Freq: Four times a day (QID) | ORAL | Status: AC
  Administered 2013-05-01 – 2013-05-02 (×5): 25 mg via ORAL
  Filled 2013-04-30 (×6): qty 1

## 2013-04-30 MED ORDER — LOPERAMIDE HCL 2 MG PO CAPS: 2.0000 mg | ORAL_CAPSULE | ORAL | Status: AC | PRN

## 2013-04-30 MED ORDER — NICOTINE 21 MG/24HR TD PT24
21.0000 mg | MEDICATED_PATCH | Freq: Every day | TRANSDERMAL | Status: DC
  Administered 2013-05-02 – 2013-05-05 (×4): 21 mg via TRANSDERMAL
  Filled 2013-04-30 (×8): qty 1

## 2013-04-30 MED ORDER — CHLORDIAZEPOXIDE HCL 25 MG PO CAPS
25.0000 mg | ORAL_CAPSULE | Freq: Every day | ORAL | Status: AC
  Administered 2013-05-05: 25 mg via ORAL
  Filled 2013-04-30: qty 1

## 2013-04-30 MED ORDER — ALUM & MAG HYDROXIDE-SIMETH 200-200-20 MG/5ML PO SUSP
30.0000 mL | ORAL | Status: DC | PRN
  Administered 2013-05-02 – 2013-05-05 (×2): 30 mL via ORAL

## 2013-04-30 MED ORDER — CHLORDIAZEPOXIDE HCL 25 MG PO CAPS
25.0000 mg | ORAL_CAPSULE | Freq: Once | ORAL | Status: AC
  Administered 2013-04-30: 25 mg via ORAL
  Filled 2013-04-30: qty 1
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
## 2013-04-30 NOTE — ED Notes (Signed)
Spoke with Ian MalkinZach from Social work to come see pt in 1 hour
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
## 2013-04-30 NOTE — ED Notes (Signed)
CALLED [facility] AND TOYKA ADVISES NO ONE HAS CALLED IN THE TTS REQUEST. SHE ALSO ADVISES SHE IS READY TO SEE PT NOW. MACHINE IS IN ROOM

## 2013-04-30 NOTE — ED Notes (Signed)
Pt placed in scrubs, belonging taken away, pt explained procedure. Security paged to wand pt.

## 2013-04-30 NOTE — ED Notes (Signed)
Pt. Left eye irrigated with saline.
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
## 2013-04-30 NOTE — ED Notes (Signed)
Pt requesting to speak with EDP again; EDP aware
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
## 2013-04-30 NOTE — Discharge Instructions (Signed)
Use antibiotic eye drops for 5 days. Take ibuprofen and tylenol for pain, use ice.  If you were given medicines take as directed.  If you are on coumadin or contraceptives realize their levels and effectiveness is altered by many different medicines.  If you have any reaction (rash, tongues swelling, other) to the medicines stop taking and see a physician.   Please follow up as directed and return to the ER or see a physician for new or worsening symptoms.  Thank you.  Emergency Department Resource Guide 1) Find a Doctor and Pay Out of Pocket Although you won't have to find out who is covered by your insurance plan, it is a good idea to ask around and get recommendations. You will then need to call the office and see if the doctor you have chosen will accept you as a new patient and what types of options they offer for patients who are self-pay. Some doctors offer discounts or will set up payment plans for their patients who do not have insurance, but you will need to ask so you aren't surprised when you get to your appointment.  2) Contact Your Local Health Department Not all health departments have doctors that can see patients for sick visits, but many do, so it is worth a call to see if yours does. If you don't know where your local health department is, you can check in your phone book. The CDC also has a tool to help you locate your state's health department, and many state websites also have listings of all of their local health departments.  3) Find a Walk-in Clinic If your illness is not likely to be very severe or complicated, you may want to try a walk in clinic. These are popping up all over the country in pharmacies, drugstores, and shopping centers. They're usually staffed by nurse practitioners or physician assistants that have been trained to treat common illnesses and complaints. They're usually fairly quick and inexpensive. However, if you have serious medical issues or chronic  medical problems, these are probably not your best option.  No Primary Care Doctor: - Call Health Connect at  [phone] - they can help you locate a primary care doctor that  accepts your insurance, provides certain services, etc. - Physician Referral Service- [phone]  Chronic Pain Problems: Organization         Address  Phone   Notes  [facility]  [phone]) [phone] Patients need to be referred by their primary care doctor.   Medication Assistance: Organization         Address  Phone   Notes  [facility] [address] [place], [place] [address] [phone]) [phone] --Must be a resident of [facility] [place] -- Must have NO insurance coverage whatsoever (no Medicaid/ Medicare, etc.) -- The pt. MUST have a primary care doctor that directs their care regularly and follows them in the community   Med[facility]ist  [phone]) [phone]   [employer]  [phone]) [phone]    Agencies that provide inexpensive medical care: Organization         Address  Phone   Notes  [facility]  [phone]   [facility]    [phone]   [facility] [address] [place], [place] [address] [phone]) [phone]   [facility] [address], [place] [phone]   [facility]    [phone]  [facility]    [phone]   [facility]  [address], [place] Phone:  [phone], Fax:  [phone] Hours of Operation:  9 am - 6 pm, M-F.  Also accepts Medicaid/Medicare and self-pay.  [facility] [place] [address], [address], [place] Phone: [phone], Fax: [phone]. Hours of Operation:  8:30 am - 5:30 pm, M-F.  Also accepts Medicaid and self-pay.  [facility] [place] [address], [place] Phone: [phone]   [place], [place], [place] [phone],  Ext. [phone] Mondays & Thursdays: 7-9 AM.  First 15 patients are seen on a first come, first serve basis.    [place] Providers:  Organization         Address  Phone   Notes  [facility] [address], [place] [phone] Also accepts self-pay patients.  [facility] [address], [place]  [phone]   [place], [address], [place] [phone]   [facility] [address] [place] [phone]   Lucianne Lei [address], [place]   [phone] Only accepts [place] Access [place] patients after they have their name applied to their card.   Self-Pay (no insurance) in [facility]:  Organization         Address  Phone   Notes  Sickle Cell Patients, [facility] [place] [phone]   [facility] [place] [phone]   [facility] [place]  [place], [place], [place] [phone]   [facility]/Dr. Osei-Bonsu  [address], [place] [phone] Phone number for both [place] and [place] locations is the same.  [facility] [address], [place] [phone]   [facility] [address] [place] or [address] [phone]   [facility] [address] [place] [phone], phone; [phone], fax Sees patients 1st and 3rd Saturday of every month.  Must not qualify for public or private insurance (i.e. Medicaid, Medicare, [place] Health Choice, Veterans' Benefits)  Household income should be no more than 200% of the poverty level The clinic cannot treat you if you are pregnant or think you are pregnant  Sexually transmitted diseases are not  treated at the clinic.    Dental Care: Organization         Address  Phone  Notes  [facility] of [facility] [phone] Accepts children up to age 45 who are enrolled in [place] or [place]; pregnant women with a Medicaid card; and children who have applied for Medicaid or [place] Health Choice, but were declined, whose parents can pay a reduced fee at time of service.  [facility]  [address], [place] [phone] Accepts children up to age 59 who are enrolled in [place] or [place]; pregnant women with a Medicaid card; and children who have applied for Medicaid or [place] Health Choice, but were declined, whose parents can pay a reduced fee at time  of service.  °[facility] ° [address], [place] [phone] Patients are seen by appointment only. Walk-ins are not accepted. [facility] will see patients 18 years of age and older. °Monday - Tuesday (8am-5pm) °Most Wednesdays (8:30-5pm) °$30 per visit, cash only  °[facility] ° [address], [place] [phone] Patients are seen by appointment only. Walk-ins are not accepted. [facility] will see patients 18 years of age and older. °One Wednesday Evening (Monthly: Volunteer Based).  $30 per visit, cash only  °[facility]  [phone] for adults; Children under age 4, call Graduate Pediatric Dentistry at [phone]. Children aged 4-14, please call [phone] to request a pediatric application. ° Dental services are provided in all areas of dental care including fillings, crowns and bridges, complete and partial dentures, implants, gum treatment, root canals, and extractions. Preventive care is also provided. Treatment is provided to both adults and children. °Patients are selected via a lottery and there is  often a waiting list. °  °[facility] [address], °[place] ° ([phone] [email or web address] °  °[facility] [address], [place], [place] [phone], Ext. [phone] Second and Fourth Thursday of each month, opens at 6:30 AM; Clinic ends at 9 AM.  Patients are seen on a first-come first-served basis, and a limited number are seen during each clinic.  ° °[facility] ° [address], [place], [place] [phone]   Eligibility Requirements °You must have lived in [place], [place], or [place] counties for at least the last three months. °  You cannot be eligible for state or federal sponsored healthcare insurance, including Veterans Administration, Medicaid, or Medicare. °  You generally cannot be eligible for healthcare insurance through your employer.  °  How to apply: °Eligibility screenings are held every Tuesday and Wednesday afternoon from 1:00 pm until 4:00 pm. You do not need an appointment for the interview!  °[facility] [address], [place], [place] [phone]   °[facility]  [phone]   °[facility]  [phone]   °[facility]  336-570-6415   ° °Behavioral Health Resources in the Community: °Intensive Outpatient Programs °Organization         Address  Phone  Notes  °[facility] [address], [place], [place] [phone]   °[facility] [address], [place], [place] [phone]   °[facility] [address], [place], [place] ° [phone]   °[facility] [address],  °[place], [place] 1-800-853-5163 or 336-641-4981   °Substance Abuse Resources °Organization         Address  Phone  Notes  °[facility]  336-882-2125   °[facility]  336-784-9470   °[facility]  336-285-9073   °[facility]  336-845-3988   °Residential & Outpatient Substance  Abuse Program  1-800-659-3381   °Psychological Services °Organization         Address  Phone  Notes  °[facility]  336- [phone]   °[facility]  336- [phone]   °[facility] [address], [place] 1-800-853-5163 or 336-641-4981   ° °Mobile Crisis Teams °Organization         Address  Phone  Notes  °[facility], Mobile Crisis Care Unit  1-877-626-1772   °Assertive °Psychotherapeutic   Services  [address] [place], [place]   [facility] [address], [place] [phone]    Self-Help/Support Groups Organization         Address  Phone             Notes  [place]. of [place] - variety of support groups  [place] Call for more information  Narcotics Anonymous (NA), [facility] [address], [employer] [place]  2 meetings at this location   Special educational needs teacher         Address  Phone  Notes  [facility] [place],    [place]  1-6361518779   [facility]  [address] [place] [address], [place], [place]   [place], [place] [phone] Admissions: 8am-3pm M-F  Incentives Substance [place] [address]    [place], [place] [phone]   [facility] [address] [place], [place], [place]   [facility] [address],  [place], [place]   [facility] - Intensive Outpatient [address], [place], [place]   [facility] ([place].,  [place], [place] 1-(302)[phone] or [phone]   [facility] ([facility]) [address] [place], [place] Accepts Medicaid  [facility] [place] [address],  [place] 1-7401063853 Substance Abuse/Addiction Treatment   [facility] Organization          Address  Phone  Notes  [facility]  6098293892   Domenic Schwab, PhD [address] [place], [place]   201-169-7765 or [phone]   [place], [place] [phone]   [facility] [address], [place], [place] [phone] Insurance/Medicaid/sponsorship through [facility] [address], [address] [place]                                    [place], [place] [phone] [place] [address], [place] [phone]    Dr. Adele Schilder  [phone]   [facility] Dept. 1) [address] [place] 2) [place] 3)  [place] [address], [place] [phone]  [phone]   [place] [phone] or [phone] (After Hours)

## 2013-04-30 NOTE — ED Notes (Signed)
Spoke with social work and stated they would be by to see the patient.

## 2013-04-30 NOTE — ED Notes (Signed)
Telepsych machine set up and at bedside.  

## 2013-04-30 NOTE — BH Assessment (Signed)
[facility] Serena ColonelAggie Nwoko, NP to Dr. Geoffery LyonsIrving Lugo. The room assignment is 306-1.
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
## 2013-04-30 NOTE — ED Notes (Signed)
[place] at bedside.  

## 2013-04-30 NOTE — Progress Notes (Signed)
[facility] Group Notes:  (Nursing/MHT/Case Management/Adjunct)  Date:  04/30/2013  Time:  2100 Type of Therapy:  wrap up group  Participation Level:  Active  Participation Quality:  Appropriate, Attentive and Sharing  Affect:  Appropriate  Cognitive:  Lacking  Insight:  Appropriate  Engagement in Group:  Engaged  Modes of Intervention:  Clarification, Education and Support  Summary of Progress/Problems:  [name] [name], [name] Carol 04/30/2013, 11:11 PM
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
## 2013-04-30 NOTE — ED Notes (Signed)
[facility] has arrived to transport pt. 

## 2013-04-30 NOTE — ED Notes (Signed)
Security has wanded pt. Nothing found.

## 2013-04-30 NOTE — ED Notes (Signed)
Dr. Jodi MourningZavitz at bedside to use woods lamp and ultrasound pt. Eye.

## 2013-04-30 NOTE — ED Notes (Signed)
Per [facility] been [facility]. They are waiting for a discharge to happen and pt can come over. Bed availability is expected today

## 2013-04-30 NOTE — Tx Team (Signed)
Initial Interdisciplinary Treatment Plan  PATIENT STRENGTHS: (choose at least two) Communication skills General fund of knowledge Motivation for treatment/growth  PATIENT STRESSORS: Financial difficulties Medication change or noncompliance Substance abuse   PROBLEM LIST: Problem List/Patient Goals Date to be addressed Date deferred Reason deferred Estimated date of resolution  Suicidal ideation 04/30/2013   D/c        Substance abuse 04/30/2013   D/c        depression 04/30/2013   D/c                           DISCHARGE CRITERIA:  Ability to meet basic life and health needs Adequate post-discharge living arrangements Improved stabilization in mood, thinking, and/or behavior Medical problems require only outpatient monitoring Motivation to continue treatment in a less acute level of care Need for constant or close observation no longer present Reduction of life-threatening or endangering symptoms to within safe limits Safe-care adequate arrangements made Verbal commitment to aftercare and medication compliance Withdrawal symptoms are absent or subacute and managed without 24-hour nursing intervention  PRELIMINARY DISCHARGE PLAN: Attend aftercare/continuing care group Attend PHP/IOP Attend 12-step recovery group Outpatient therapy Placement in alternative living arrangements  PATIENT/FAMIILY INVOLVEMENT: This treatment plan has been presented to and reviewed with the patient, Thermon Leylandshley XXXBoone.  The patient and family have been given the opportunity to ask questions and make suggestions.  Quintella ReichertKnight, Destany Severns [place] 04/30/2013, 6:42 PM

## 2013-04-30 NOTE — ED Notes (Addendum)
Pt asking for pain medication and something to eat. Meal tray ordered.

## 2013-05-01 DIAGNOSIS — F4310 Post-traumatic stress disorder, unspecified: Secondary | ICD-10-CM

## 2013-05-01 DIAGNOSIS — F1410 Cocaine abuse, uncomplicated: Secondary | ICD-10-CM

## 2013-05-01 DIAGNOSIS — F1010 Alcohol abuse, uncomplicated: Secondary | ICD-10-CM

## 2013-05-01 LAB — URINALYSIS, ROUTINE W REFLEX MICROSCOPIC
Bilirubin Urine: NEGATIVE
Glucose, UA: NEGATIVE mg/dL
HGB URINE DIPSTICK: NEGATIVE
Ketones, ur: NEGATIVE mg/dL
Leukocytes, UA: NEGATIVE
Nitrite: NEGATIVE
PH: 6 (ref 5.0–8.0)
PROTEIN: NEGATIVE mg/dL
Specific Gravity, Urine: 1.026 (ref 1.005–1.030)
Urobilinogen, UA: 1 mg/dL (ref 0.0–1.0)

## 2013-05-01 LAB — PREGNANCY, URINE: Preg Test, Ur: NEGATIVE

## 2013-05-01 MED ORDER — LISINOPRIL 10 MG PO TABS
10.0000 mg | ORAL_TABLET | Freq: Every day | ORAL | Status: AC
  Administered 2013-05-01 – 2013-05-05 (×5): 10 mg via ORAL
  Filled 2013-05-01: qty 1
  Filled 2013-05-01: qty 2
  Filled 2013-05-01 (×4): qty 1

## 2013-05-01 MED ORDER — CARBAMAZEPINE ER 200 MG PO TB12
200.0000 mg | ORAL_TABLET | Freq: Two times a day (BID) | ORAL | Status: DC
  Administered 2013-05-01 – 2013-05-04 (×6): 200 mg via ORAL
  Filled 2013-05-01 (×8): qty 1

## 2013-05-01 NOTE — Progress Notes (Signed)
D:Patient in the hallway on approach.  Patient state she had a good day but states her tooth and the left side of her face continue to hurt her.  Patient states she is her to get help and states she wants to got to some type of long term treatment when discharged.  Patient has been interacting with peers and playing group activities with them tonight.  Patient states she is concerned about her blood pressured stating it has never been high before.  Patient told writer she was started on a new medication for her blood pressure (Lisinopril.)  Patient does appear to be motivated for treatment.  Patient voice loud and can appear to be aggressive at times but patient non-threateninng and pleasant. A: Staff to monitor Q 15 mins for safety.  Encouragement and support offered.  Scheduled medications administered per orders.  Ibuprofen administered prn for tooth pain.  Trazodone administered prn for sleep. R: Patient remains safe on the unit.  Patient attended group tonight.  Patient taking administered medications.  Patient visible on the unit and interacting with peers.

## 2013-05-01 NOTE — Tx Team (Signed)
Interdisciplinary Treatment Plan Update (Adult)  Date: 05/01/2013  Time Reviewed:  9:45 AM  Progress in Treatment: Attending groups: Yes Participating in groups:  Yes Taking medication as prescribed:  Yes Tolerating medication:  Yes Family/Significant othe contact made: CSW assessing Patient understands diagnosis:  Yes Discussing patient identified problems/goals with staff:  Yes Medical problems stabilized or resolved:  Yes Denies suicidal/homicidal ideation: Yes Issues/concerns per patient self-inventory:  Yes Other:  New problem(s) identified: N/A  Discharge Plan or Barriers: CSW assessing for appropriate referrals.    Reason for Continuation of Hospitalization: Anxiety Depression Detox Medication Stabilization  Comments: N/A  Estimated length of stay: 3-5 days  For review of initial/current patient goals, please see plan of care.  Attendees: Patient:     Family:     Physician: 05/01/2013 10:31 AM   Nursing:    05/01/2013 10:31 AM   Clinical Social Worker:  Reyes Ivanhelsea Horton, LCSW 05/01/2013 10:31 AM   Other: Onnie BoerJennifer Clark, RN case manager 05/01/2013 10:31 AM   Other:  Trula SladeHeather Smart, LCSWA 05/01/2013 10:31 AM   Other:  Serena ColonelAggie Nwoko, NP 05/01/2013 10:31 AM   Other:  Shelda JakesPatty Duke, RN 05/01/2013 10:31 AM   Other:    Other:    Other:    Other:    Other:    Other:     Scribe for Treatment Team:   Carmina MillerHorton, Tejon Gracie Nicole, 05/01/2013 , 10:31 AM

## 2013-05-01 NOTE — Progress Notes (Signed)
Recreation Therapy Notes   Date: 02.27.2015 Time: 2:45pm Location: 100 Hall Dayroom    Group Topic: Communication, Team Building, Problem Solving  Goal Area(s) Addresses:  Patient will effectively work with peer towards shared goal.  Patient will identify skill used to make activity successful.  Patient will identify how skills used during activity can be used to build healthy support system.   Behavioral Response: Appropriate   Intervention: Problem Solving Activity  Activity: [place] [employer]. Patients were provided the following materials: 5 drinking straws, 5 rubber bands, 5 paper clips, 2 index cards, 2 drinking cups, and 2 toilet paper rolls. Using the provided materials patients were asked to build a launching mechanisms to launch a ping pong ball approximately 12 feet. Patients were divided into teams of 3-5.   Education: Pharmacist, communityocial Skills, Building control surveyorDischarge Planning.   Education Outcome: Acknowledges understanding   Clinical Observations/Feedback: Patient actively engaged in group activity, working well with team mates and assisting with Holiday representativeconstruction of teams launching mechanism. Patient left group after testing her team's launching mechanism, stating she needed to use the rest room. Patient did not return to group session.   Marykay Lexenise L Selyna Klahn, LRT/CTRS  Jearl KlinefelterBlanchfield, Cassi Jenne L 05/01/2013 3:56 PM
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
## 2013-05-01 NOTE — Progress Notes (Signed)
Patient ID: [name] [name], female   DOB: [birth year], 28 y.o.   MRN: [id number]  D: Patient calm on approach this am. Patient not feeling well due to bruising and swelling on lt side of face. Reports depression "7" and feelings of hopelessness "6" on scale. Currently denies any SI at this time. Patient went to lay back down in bed. Took some ibuprofen for pain at this time. A: Staff will monitor on q 15 minute checks, follow treatment plan, and give meds as ordered. R: Cooperative on the unit.

## 2013-05-01 NOTE — [facility] Suicide Risk Assessment (Signed)
[facility] INPATIENT: Family/Significant Other Suicide Prevention Education   Suicide Prevention Education:  Education Completed; No one has been identified by the patient as the family member/significant other with whom the patient will be residing, and identified as the person(s) who will aid the patient in the event of a mental health crisis (suicidal ideations/suicide attempt).   Pt did not c/o SI at admission, nor have they endorsed SI during their stay here. SPE not required. SPI pamphlet provided to pt and he was encouraged to share information with his support network, ask questions, and talk about any concerns.   The suicide prevention education provided includes the following:  Suicide risk factors  Suicide prevention and interventions  National Suicide Hotline telephone number  [facility] assessment telephone number  [facility] Emergency Assistance 911  [facility] and/or Residential Mobile Crisis Unit telephone number  Reyes IvanChelsea Horton, [place]CS[place] 05/01/2013  2:25 PM
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
## 2013-05-01 NOTE — Clinical Social Work Note (Signed)
CSW met with pt individually to complete PSA.  Pt states that she wants to go to [facility] for further inpatient treatment.  CSW secured pt an admission date for 3/4.  CSW was informed by Merry Proud that last time pt was there she did not participate but they are willing to let her try again if she signs a behavior contract.   Regan Lemming, LCSW 05/01/2013  2:35 PM
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
## 2013-05-01 NOTE — [facility] Group Notes (Signed)
[facility]   05/01/2013  8:45 AM  Participation Quality:  Did Not Attend - pt sleeping in her room  Reyes IvanChelsea Horton, LCSW 05/01/2013 10:20 AM

## 2013-05-01 NOTE — [facility] Group Notes (Signed)
[facility] LCSW Group Therapy  05/01/2013 1:15 PM   Type of Therapy:  Group Therapy  Participation Level:  Did Not Attend - pt declined to come to group  [name] IvanChelsea Horton, LCSW 05/01/2013 2:34 PM

## 2013-05-02 DIAGNOSIS — O99340 Other mental disorders complicating pregnancy, unspecified trimester: Secondary | ICD-10-CM

## 2013-05-02 DIAGNOSIS — F1910 Other psychoactive substance abuse, uncomplicated: Secondary | ICD-10-CM

## 2013-05-02 MED ORDER — AMOXICILLIN 500 MG PO CAPS
500.0000 mg | ORAL_CAPSULE | Freq: Three times a day (TID) | ORAL | Status: DC
  Administered 2013-05-02 – 2013-05-06 (×12): 500 mg via ORAL
  Filled 2013-05-02: qty 2
  Filled 2013-05-02 (×12): qty 1
  Filled 2013-05-02: qty 16
  Filled 2013-05-02 (×2): qty 1
  Filled 2013-05-02: qty 16
  Filled 2013-05-02 (×2): qty 2
  Filled 2013-05-02 (×2): qty 1
  Filled 2013-05-02: qty 16
  Filled 2013-05-02 (×2): qty 1

## 2013-05-02 NOTE — Progress Notes (Signed)
Pt attended the evening AA group in the dayroom. 

## 2013-05-02 NOTE — Progress Notes (Signed)
[facility]  05/02/2013 1:38 PM Thermon Leylandshley XXXBoone  MRN:  [id number] Subjective:  [name] [name] [age]year old female presents for polysubstance abuse. She states she is not doing well today, they havent given me my bipolar medation "Depakote", and my tooth hurts. She rates her depression 9/10, and anxiety 9/10 at this time. She has not been participating in group due to her face hurting. Reports not sleeping well due to pain. Denies Si/HI/AVH.  Diagnosis:   DSM5: Schizophrenia Disorders:   Obsessive-Compulsive Disorders:   Trauma-Stressor Disorders:  Posttraumatic Stress Disorder (309.81) Substance/Addictive Disorders:  Alcohol Withdrawal (291.81) and Opioid Disorder - Moderate (304.00) Depressive Disorders:  Major Depressive Disorder - Moderate (296.22) Total Time spent with patient: 20 minutes  Axis I: Alcohol Abuse, Bipolar, Depressed, Major Depression, Recurrent severe, Post Traumatic Stress Disorder and Substance Abuse Axis II: Deferred Axis IV: economic problems, housing problems, occupational problems, other psychosocial or environmental problems, problems related to legal system/crime, problems related to social environment, problems with access to health care services and problems with primary support group Axis V: 21-30 behavior considerably influenced by delusions or hallucinations OR serious impairment in judgment, communication OR inability to function in almost all areas  ADL's:  Impaired  Sleep: Fair  Appetite:  Poor  Suicidal Ideation:  Plan:  Denies Intent:  denies Means:  Denies Homicidal Ideation:  Plan:  Denies Intent:  Denies Means:  Denies AEB (as evidenced by):  Psychiatric Specialty Exam: Physical Exam  ROS  Blood pressure 148/100, pulse 101, temperature 98.6 F (37 C), temperature source Oral, resp. rate 17, height 5\' 8"  (1.727 m), weight 104.781 kg (231 lb), last menstrual period 04/30/2013, SpO2 98.00%, not currently breastfeeding.Body mass index is 35.13  kg/(m^2).  General Appearance: Disheveled  Eye Contact::  Poor  Speech:  Slow  Volume:  Decreased  Mood:  Anxious, Depressed, Hopeless and Worthless  Affect:  Depressed and Flat  Thought Process:  Irrelevant  Orientation:  Full (Time, Place, and Person)  Thought Content:  WDL  Suicidal Thoughts:  No  Homicidal Thoughts:  No  Memory:  Immediate;   Good Recent;   Good Remote;   Fair  Judgement:  Fair  Insight:  Fair  Psychomotor Activity:  Restlessness  Concentration:  Fair  Recall:  Good  Fund of Knowledge:Fair  Language: Good  Akathisia:  No  Handed:  Right  AIMS (if indicated):     Assets:  Communication Skills Leisure Time Social Support Talents/Skills  Sleep:  Number of Hours: 5.75   Musculoskeletal: Strength & Muscle Tone: within normal limits Gait & Station: normal Patient leans: N/A  Current Medications: Current Facility-Administered Medications  Medication Dose Route Frequency Provider Last Rate Last Dose  . acetaminophen (TYLENOL) tablet 650 mg  650 mg Oral Q6H PRN Beau FannyJohn C Withrow, FNP   650 mg at 05/02/13 1042  . alum & mag hydroxide-simeth (MAALOX/MYLANTA) 200-200-20 MG/5ML suspension 30 mL  30 mL Oral Q4H PRN Beau FannyJohn C Withrow, FNP      . benzocaine (ORAJEL) 10 % mucosal gel   Mouth/Throat QID PRN Kristeen MansFran E Hobson, NP      . carbamazepine (TEGRETOL XR) 12 hr tablet 200 mg  200 mg Oral BID Sanjuana KavaAgnes I Nwoko, NP   200 mg at 05/02/13 [id number]  . chlordiazePOXIDE (LIBRIUM) capsule 25 mg  25 mg Oral Q6H PRN Beau FannyJohn C Withrow, FNP      . chlordiazePOXIDE (LIBRIUM) capsule 25 mg  25 mg Oral TID Beau FannyJohn C Withrow, FNP   25 mg  at 05/02/13 1205   Followed by  . [START ON 05/03/2013] chlordiazePOXIDE (LIBRIUM) capsule 25 mg  25 mg Oral BH-qamhs Beau Fanny, FNP       Followed by  . [START ON 05/05/2013] chlordiazePOXIDE (LIBRIUM) capsule 25 mg  25 mg Oral Daily Beau Fanny, FNP      . citalopram (CELEXA) tablet 10 mg  10 mg Oral Daily Beau Fanny, FNP   10 mg at 05/02/13 1610  .  hydrOXYzine (ATARAX/VISTARIL) tablet 25 mg  25 mg Oral Q6H PRN Beau Fanny, FNP      . ibuprofen (ADVIL,MOTRIN) tablet 600 mg  600 mg Oral Q6H PRN Kristeen Mans, NP   600 mg at 05/02/13 1258  . lisinopril (PRINIVIL,ZESTRIL) tablet 10 mg  10 mg Oral Daily Sanjuana Kava, NP   10 mg at 05/02/13 9604  . loperamide (IMODIUM) capsule 2-4 mg  2-4 mg Oral PRN Beau Fanny, FNP      . magnesium hydroxide (MILK OF MAGNESIA) suspension 30 mL  30 mL Oral Daily PRN Beau Fanny, FNP      . multivitamin with minerals tablet 1 tablet  1 tablet Oral Daily Beau Fanny, FNP   1 tablet at 05/02/13 [phone]  . nicotine (NICODERM CQ - dosed in mg/24 hours) patch 21 mg  21 mg Transdermal Daily Rachael Fee, MD   21 mg at 05/02/13 0836  . ondansetron (ZOFRAN-ODT) disintegrating tablet 4 mg  4 mg Oral Q6H PRN Beau Fanny, FNP      . thiamine (VITAMIN B-1) tablet 100 mg  100 mg Oral Daily Beau Fanny, FNP   100 mg at 05/02/13 0836  . traZODone (DESYREL) tablet 50 mg  50 mg Oral QHS PRN Beau Fanny, FNP   50 mg at 05/01/13 2255  . trimethoprim-polymyxin b (POLYTRIM) ophthalmic solution 2 drop  2 drop Both Eyes 6 times per day Beau Fanny, FNP   2 drop at 05/02/13 1205    Lab Results:  Results for orders placed during the hospital encounter of 04/30/13 (from the past 48 hour(s))  PREGNANCY, URINE     Status: None   Collection Time    05/01/13 10:54 PM      Result Value Ref Range   Preg Test, Ur NEGATIVE  NEGATIVE   Comment:            THE SENSITIVITY OF THIS     METHODOLOGY IS >20 mIU/mL.     Performed at [facility]  URINALYSIS, ROUTINE W REFLEX MICROSCOPIC     Status: Abnormal   Collection Time    05/01/13 10:59 PM      Result Value Ref Range   Color, Urine YELLOW  YELLOW   APPearance CLOUDY (*) CLEAR   Specific Gravity, Urine 1.026  1.005 - 1.030   pH 6.0  5.0 - 8.0   Glucose, UA NEGATIVE  NEGATIVE mg/dL   Hgb urine dipstick NEGATIVE  NEGATIVE   Bilirubin Urine  NEGATIVE  NEGATIVE   Ketones, ur NEGATIVE  NEGATIVE mg/dL   Protein, ur NEGATIVE  NEGATIVE mg/dL   Urobilinogen, UA 1.0  0.0 - 1.0 mg/dL   Nitrite NEGATIVE  NEGATIVE   Leukocytes, UA NEGATIVE  NEGATIVE   Comment: MICROSCOPIC NOT DONE ON URINES WITH NEGATIVE PROTEIN, BLOOD, LEUKOCYTES, NITRITE, OR GLUCOSE <1000 mg/dL.     Performed at [facility]    Physical Findings: AIMS: Facial and Oral  Movements Muscles of Facial Expression: None, normal Lips and Perioral Area: None, normal Jaw: None, normal Tongue: None, normal,Extremity Movements Upper (arms, wrists, hands, fingers): None, normal Lower (legs, knees, ankles, toes): None, normal, Trunk Movements Neck, shoulders, hips: None, normal, Overall Severity Severity of abnormal movements (highest score from questions above): None, normal Incapacitation due to abnormal movements: None, normal Patient's awareness of abnormal movements (rate only patient's report): No Awareness, Dental Status Current problems with teeth and/or dentures?: No Does patient usually wear dentures?: No  CIWA:  CIWA-Ar Total: 2 COWS:  COWS Total Score: 2  Treatment Plan Summary: Daily contact with patient to assess and evaluate symptoms and progress in treatment Medication management  Plan:1 Admit for crisis management and stabilization.  2. Medication management to reduce symptoms to baseline and improved the patient's overall level of functioning. Closely monitor the side effects, efficacy and therapeutic response of medication.  3. Treat health problem as indicated.  4. Developed treatment plan to decrease the risk of relapse upon discharge and to reduce the need for readmission.  5. Psychosocial education regarding relapse prevention in self-care.  6. Healthcare followup as needed for medical problems and called consults as indicated.  7. Increase collateral information.  8. Restart home medication where appropriate  9. Encouraged to  participate and verbalize into group milieu therapy.  10. Dental caries/Pulpitis -Amoxicillin 500mg  1 tablet po TID x 10 days, refer to dental clinic for extraction. Continue Ibuprofen and Orajel.   Medical Decision Making Problem Points:  Established problem, worsening (2), New problem, with additional work-up planned (4), Review of last therapy session (1) and Review of psycho-social stressors (1) Data Points:  Review or order clinical lab tests (1) Review or order medicine tests (1) Review of medication regiment & side effects (2) Review of new medications or change in dosage (2)  I certify that inpatient services furnished can reasonably be expected to improve the patient's condition.   Malachy Chamber S 05/02/2013, 1:38 PM

## 2013-05-02 NOTE — Progress Notes (Signed)
05-02-13  NSG NOTE  7a-3p  D: Affect is animated and depressed.  Mood is depressed.  Behavior is cooperative with encouragement, direction and support, requires occasional redirection.  Interacts appropriately with peers and staff with direction.  Participated in RN group, counselor lead group.   Reports poor sleep and good appetite.  States energy level is low and poor ability to to maintain attention. Continues to complain of left sided facial pain and toothache on left side .  Identified changes that  would have to be made post discharge.  A:  Medications per MD order.  Support given throughout day.  1:1 time spent with pt.  R:  Following treatment plan. Denies HI/SI, auditory or visual hallucinations.  Contracts for safety.

## 2013-05-02 NOTE — [facility] Group Notes (Signed)
[facility] Group Notes: (Clinical Social Work)   05/02/2013      Type of Therapy:  Group Therapy   Participation Level:  Did Not Attend    [name] MantleMareida Grossman-Orr, LCSW 05/02/2013, 12:41 PM

## 2013-05-02 NOTE — [facility] Group Notes (Signed)
[facility] Group Notes:  (Nursing/MHT/Case Management/Adjunct)  Date:  05/02/2013  Time:  5:07 PM  Type of Therapy:  Did not attend  Participation Level:  Did Not Attend  Participation Quality:  Did not attend  Affect:  Did not attend  Cognitive:  Did not attend  Insight:  None  Engagement in Group:  None  Modes of Intervention:  Did not attend  Summary of Progress/Problems:  Inda MerlinWilliams, Asusena Sigley R 05/02/2013, 5:07 PM

## 2013-05-02 NOTE — Progress Notes (Signed)
Pt attended AA group this evening.  

## 2013-05-03 LAB — CARBAMAZEPINE LEVEL, TOTAL: Carbamazepine Lvl: 4.2 ug/mL (ref 4.0–12.0)

## 2013-05-03 MED ORDER — CITALOPRAM HYDROBROMIDE 20 MG PO TABS
20.0000 mg | ORAL_TABLET | Freq: Every day | ORAL | Status: DC
  Administered 2013-05-04 – 2013-05-05 (×2): 20 mg via ORAL
  Filled 2013-05-03 (×3): qty 1
  Filled 2013-05-03: qty 14
  Filled 2013-05-03 (×2): qty 1

## 2013-05-03 MED ORDER — TRIAMCINOLONE ACETONIDE 0.1 % EX CREA
TOPICAL_CREAM | Freq: Two times a day (BID) | CUTANEOUS | Status: DC
  Administered 2013-05-03 – 2013-05-05 (×5): via TOPICAL
  Filled 2013-05-03: qty 15

## 2013-05-03 NOTE — Progress Notes (Signed)
05-03-13 NSG NOTE 7a-3p D: Affect is depressed, brightens on approach and with interaction. Mood is depressed. Behavior is cooperative with encouragement, direction and support, requires occasional redirection. Interacts appropriately with peers and staff with direction. Participated in RN group, counselor lead group. Reports fair sleep and improving appetite.  Focus of today was to identify healthy support systems. States energy level is low and poor ability to to maintain attention. Identified changes that would have to be made post discharge. A: Medications per MD order. Support given throughout day. 1:1 time spent with pt. R: Following treatment plan. Denies HI/SI, auditory or visual hallucinations. Contracts for safety.

## 2013-05-03 NOTE — Progress Notes (Signed)
Adult Psychoeducational Group Note  Date:  05/03/2013 Time:  6:28 PM  Group Topic/Focus:  Coping With Mental Health Crisis:   The purpose of this group is to help patients identify strategies for coping with mental health crisis.  Group discusses possible causes of crisis and ways to manage them effectively.  Participation Level:  Active  Participation Quality:  Appropriate  Affect:  Appropriate  Cognitive:  Appropriate  Insight: Appropriate  Engagement in Group:  Engaged  Modes of Intervention:  Discussion  Additional Comments:  Pt was present in group and played coping skills pictionary. Patient said that she likes to listen to music and go outside when she is stressed out. She missed the beginning of group, but participated once she was there. She tried to go on [name] few tangents with other group members, but was redirectable.  [name] [name], [name] [name] 05/03/2013, 6:28 PM

## 2013-05-03 NOTE — Progress Notes (Signed)
Patient did attend the evening speaker AA meeting.  

## 2013-05-03 NOTE — Progress Notes (Signed)
Pt has had frequent requests this evening. She is loud with forceful speech at times but is cooperative. Complaining of continuous L lower molar pain for which she is receiving alternating tylenol and motrin, orajel and antibiotics. Pain remains at an 8-9 even with interventions. Pt feels the only intervention that will relieve pain is to have the tooth pulled. Otherwise pt denies complaints though mood remains labile and anxious. Medicated per orders, support given. Denies SI/HI/AVH and remains safe. Lawrence MarseillesFriedman, Daila Elbert Eakes

## 2013-05-03 NOTE — [facility] Group Notes (Signed)
[facility] Group Notes:  (Nursing/MHT/Case Management/Adjunct)  Date:  05/03/2013  Time:  1:28 PM  Type of Therapy:  Psychoeducational Skills  Participation Level:  Did Not Attend  Participation Quality:  Did not attend  Affect:  Did not attend  Cognitive:  Did not attend  Insight:  None  Engagement in Group:  None  Modes of Intervention:  did not attend  Summary of Progress/Problems:Did not attend despite staff encouragement  Inda MerlinWilliams, Mclain Freer R 05/03/2013, 1:28 PM

## 2013-05-03 NOTE — Progress Notes (Signed)
[facility] MD Progress Note  05/03/2013 10:16 AM [name] [name]  MRN:  [id number] Subjective:  [name] [name] 28year old female presents for polysubstance abuse. She states she is not doing well today, they woke me up and told me to go to class and my tooth still hurts I need something stronger. She rates her depression 9/10, and anxiety 9/10 at this time. Depakote and Celexa work well for me, I been going through mental health issues since I was 13. Reports not sleeping well due to pain, and states she has been doing a lot of dreaming and talking in her sleep. Denies SI/HI/AVH. Upon discharge she plans to go to [facility], States " I don't wanna go back in these streets"  Diagnosis:   DSM5: Schizophrenia Disorders:   Obsessive-Compulsive Disorders:   Trauma-Stressor Disorders:  Posttraumatic Stress Disorder (309.81) Substance/Addictive Disorders:  Alcohol Withdrawal (291.81) and Opioid Disorder - Moderate (304.00) Depressive Disorders:  Major Depressive Disorder - Moderate (296.22) Total Time spent with patient: 20 minutes  Axis I: Alcohol Abuse, Bipolar, Depressed, Major Depression, Recurrent severe, Post Traumatic Stress Disorder and Substance Abuse Axis II: Deferred Axis IV: economic problems, housing problems, occupational problems, other psychosocial or environmental problems, problems related to legal system/crime, problems related to social environment, problems with access to health care services and problems with primary support group Axis V: 21-30 behavior considerably influenced by delusions or hallucinations OR serious impairment in judgment, communication OR inability to function in almost all areas  ADL's:  Impaired  Sleep: Fair  Appetite:  Poor  Suicidal Ideation:  Plan:  Denies Intent:  denies Means:  Denies Homicidal Ideation:  Plan:  Denies Intent:  Denies Means:  Denies AEB (as evidenced by):  Psychiatric Specialty Exam: Physical Exam   ROS   Blood pressure 128/91, pulse 92,  temperature 97.5 F (36.4 C), temperature source Oral, resp. rate 18, height 5\' 8"  (1.727 m), weight 104.781 kg (231 lb), last menstrual period 04/30/2013, SpO2 98.00%, not currently breastfeeding.Body mass index is 35.13 kg/(m^2).  General Appearance: Disheveled  Eye Contact::  Poor  Speech:  Slow  Volume:  Decreased  Mood:  Anxious, Depressed, Hopeless and Worthless  Affect:  Depressed and Flat  Thought Process:  Irrelevant  Orientation:  Full (Time, Place, and Person)  Thought Content:  WDL  Suicidal Thoughts:  No  Homicidal Thoughts:  No  Memory:  Immediate;   Good Recent;   Good Remote;   Fair  Judgement:  Fair  Insight:  Fair  Psychomotor Activity:  Restlessness  Concentration:  Fair  Recall:  Good  Fund of Knowledge:Fair  Language: Good  Akathisia:  No  Handed:  Right  AIMS (if indicated):     Assets:  Communication Skills Leisure Time Social Support Talents/Skills  Sleep:  Number of Hours: 6.5   Musculoskeletal: Strength & Muscle Tone: within normal limits Gait & Station: normal Patient leans: N/A  Current Medications: Current Facility-Administered Medications  Medication Dose Route Frequency Provider Last Rate Last Dose  . acetaminophen (TYLENOL) tablet 650 mg  650 mg Oral Q6H PRN Beau Fanny, FNP   650 mg at 05/03/13 0737  . alum & mag hydroxide-simeth (MAALOX/MYLANTA) 200-200-20 MG/5ML suspension 30 mL  30 mL Oral Q4H PRN Beau Fanny, FNP   30 mL at 05/02/13 1952  . amoxicillin (AMOXIL) capsule 500 mg  500 mg Oral 3 times per day Truman Hayward, FNP   500 mg at 05/03/13 4098  . benzocaine (ORAJEL) 10 % mucosal gel  Mouth/Throat QID PRN Kristeen MansFran E Hobson, NP      . carbamazepine (TEGRETOL XR) 12 hr tablet 200 mg  200 mg Oral BID Sanjuana KavaAgnes I Nwoko, NP   200 mg at 05/03/13 0731  . chlordiazePOXIDE (LIBRIUM) capsule 25 mg  25 mg Oral Q6H PRN Beau FannyJohn C Withrow, FNP      . chlordiazePOXIDE (LIBRIUM) capsule 25 mg  25 mg Oral BH-qamhs Beau FannyJohn C Withrow, FNP       Followed  by  . [START ON 05/05/2013] chlordiazePOXIDE (LIBRIUM) capsule 25 mg  25 mg Oral Daily Beau FannyJohn C Withrow, FNP      . citalopram (CELEXA) tablet 10 mg  10 mg Oral Daily Beau FannyJohn C Withrow, FNP   10 mg at 05/03/13 [id number]  . hydrOXYzine (ATARAX/VISTARIL) tablet 25 mg  25 mg Oral Q6H PRN Beau FannyJohn C Withrow, FNP      . ibuprofen (ADVIL,MOTRIN) tablet 600 mg  600 mg Oral Q6H PRN Kristeen MansFran E Hobson, NP   600 mg at 05/02/13 2159  . lisinopril (PRINIVIL,ZESTRIL) tablet 10 mg  10 mg Oral Daily Sanjuana KavaAgnes I Nwoko, NP   10 mg at 05/03/13 0732  . loperamide (IMODIUM) capsule 2-4 mg  2-4 mg Oral PRN Beau FannyJohn C Withrow, FNP      . magnesium hydroxide (MILK OF MAGNESIA) suspension 30 mL  30 mL Oral Daily PRN Beau FannyJohn C Withrow, FNP      . multivitamin with minerals tablet 1 tablet  1 tablet Oral Daily Beau FannyJohn C Withrow, FNP   1 tablet at 05/03/13 0731  . nicotine (NICODERM CQ - dosed in mg/24 hours) patch 21 mg  21 mg Transdermal Daily Rachael FeeIrving A Lugo, MD   21 mg at 05/03/13 [id number]  . ondansetron (ZOFRAN-ODT) disintegrating tablet 4 mg  4 mg Oral Q6H PRN Beau FannyJohn C Withrow, FNP      . thiamine (VITAMIN B-1) tablet 100 mg  100 mg Oral Daily Beau FannyJohn C Withrow, FNP   100 mg at 05/03/13 0731  . traZODone (DESYREL) tablet 50 mg  50 mg Oral QHS PRN Beau FannyJohn C Withrow, FNP   50 mg at 05/02/13 2157  . trimethoprim-polymyxin b (POLYTRIM) ophthalmic solution 2 drop  2 drop Both Eyes 6 times per day Beau FannyJohn C Withrow, FNP   2 drop at 05/03/13 [id number]    Lab Results:  Results for orders placed during the hospital encounter of 04/30/13 (from the past 48 hour(s))  PREGNANCY, URINE     Status: None   Collection Time    05/01/13 10:54 PM      Result Value Ref Range   Preg Test, Ur NEGATIVE  NEGATIVE   Comment:            THE SENSITIVITY OF THIS     METHODOLOGY IS >20 mIU/mL.     [facility]formed [facility]  URINALYSIS, ROUTINE W REFLEX MICROSCOPIC     Status: Abnormal   Collection Time    05/01/13 10:59 PM      Result Value Ref Range   Color, Urine YELLOW   YELLOW   APPearance CLOUDY (*) CLEAR   Specific Gravity, Urine 1.026  1.005 - 1.030   pH 6.0  5.0 - 8.0   Glucose, UA NEGATIVE  NEGATIVE mg/dL   Hgb urine dipstick NEGATIVE  NEGATIVE   Bilirubin Urine NEGATIVE  NEGATIVE   Ketones, ur NEGATIVE  NEGATIVE mg/dL   Protein, ur NEGATIVE  NEGATIVE mg/dL   Urobilinogen, UA 1.0  0.0 - 1.0 mg/dL  Nitrite NEGATIVE  NEGATIVE   Leukocytes, UA NEGATIVE  NEGATIVE   Comment: MICROSCOPIC NOT DONE ON URINES WITH NEGATIVE PROTEIN, BLOOD, LEUKOCYTES, NITRITE, OR GLUCOSE <1000 mg/dL.     Performed at [facility]    Physical Findings: AIMS: Facial and Oral Movements Muscles of Facial Expression: None, normal Lips and Perioral Area: None, normal Jaw: None, normal Tongue: None, normal,Extremity Movements Upper (arms, wrists, hands, fingers): None, normal Lower (legs, knees, ankles, toes): None, normal, Trunk Movements Neck, shoulders, hips: None, normal, Overall Severity Severity of abnormal movements (highest score from questions above): None, normal Incapacitation due to abnormal movements: None, normal Patient's awareness of abnormal movements (rate only patient's report): No Awareness, Dental Status Current problems with teeth and/or dentures?: No Does patient usually wear dentures?: No  CIWA:  CIWA-Ar Total: 1 COWS:  COWS Total Score: 2  Treatment Plan Summary: Daily contact with patient to assess and evaluate symptoms and progress in treatment Medication management  Plan:1 Admit for crisis management and stabilization.  2. Medication management to reduce symptoms to baseline and improved the patient's overall level of functioning. Closely monitor the side effects, efficacy and therapeutic response of medication.  3. Treat health problem as indicated.  4. Developed treatment plan to decrease the risk of relapse upon discharge and to reduce the need for readmission.  5. Psychosocial education regarding relapse prevention in  self-care.  6. Healthcare followup as needed for medical problems and called consults as indicated.  7. Increase collateral information.  8. Restart home medication where appropriate  9. Encouraged to participate and verbalize into group milieu therapy.  10. Dental caries/Pulpitis -Amoxicillin 500mg  1 tablet po TID x 10 days, refer to dental clinic for extraction. Continue Ibuprofen and Orajel.  11.BP is elevated, no previous hx of hypertension suspect elevated readings due to detox. (A) Initiated lisinopril 10mg  1 tablet x 1 daily. Will continue to monitor  Medical Decision Making Problem Points:  Established problem, worsening (2), New problem, with additional work-up planned (4), Review of last therapy session (1) and Review of psycho-social stressors (1) Data Points:  Review or order clinical lab tests (1) Review or order medicine tests (1) Review of medication regiment & side effects (2) Review of new medications or change in dosage (2)  I certify that inpatient services furnished can reasonably be expected to improve the patient's condition.   Malachy Chamber S 05/03/2013, 10:16 AM

## 2013-05-03 NOTE — [facility] Group Notes (Signed)
[facility] Group Notes:  (Nursing/MHT/Case Management/Adjunct)  Date:  05/03/2013  Time:  1:30 PM  Type of Therapy:  Psychoeducational Skills  Participation Level:  Active  Participation Quality:  Appropriate  Affect:  Appropriate  Cognitive:  Oriented  Insight:  Good  Engagement in Group:  Engaged  Modes of Intervention:  Discussion, Education and Support  Summary of Progress/Problems: Video on how attitude effects addiction, with group discussion on video. Inda MerlinWilliams, Maysie Parkhill R 05/03/2013, 1:30 PM

## 2013-05-03 NOTE — Progress Notes (Signed)
Adult Psychoeducational Group Note  Date:  05/03/2013 Time:  0900  Group Topic/Focus:  Making Healthy Choices:   The focus of this group is to help patients identify negative/unhealthy choices they were using prior to admission and identify positive/healthier coping strategies to replace them upon discharge.  Participation Level:  Did Not Attend   Barbette MerinoJENSEN, [name] [name] Shari Prowsvan 05/03/2013, 2:21 PM

## 2013-05-03 NOTE — Progress Notes (Signed)
Pt continues to complain of tooth pain. Does not feel interventions are helping but continues to ask for prns. Believes the dental consult ordered by NP over the weekend will result in the extraction of tooth alleviating the pain. Her mood is labile and anxious. She remains loud and intrusive but is redirectable and cooperative. Medicated per orders. Given ibuprofen, orajel and a heat pack for pain however after interventions it remains an 8/10. Denies SI/HI/AVH and remains safe. Lawrence MarseillesFriedman, Ahni Bradwell Eakes

## 2013-05-03 NOTE — Clinical Social Work Note (Signed)
At patient's request, CSW provided clothing to patient from the clothing closet, including shirt and pants.  Sarina SerGrossman-Orr, Torrian Canion Jo 05/03/2013 6:10 PM

## 2013-05-03 NOTE — B[facility] Group Notes (Signed)
B[facility] Group Notes:  (Clinical Social Work)  05/03/2013  10:00-11:00AM  Summary of Progress/Problems:   The main focus of today's process group was to   identify the patient's current support system and decide on other supports that can be put in place.  The picture on workbook was used to discuss why additional supports are needed.  An emphasis was placed on using counselor, doctor, therapy groups, 12-step groups, and problem-specific support groups to expand supports.   There was also an extensive discussion about what constitutes a healthy support versus an unhealthy support.  The patient expressed full comprehension of the concepts presented, and agreed that there is a need to add more supports.  The patient stated the current supports in place are herself, 2 sisters, her boyfriend, some friends and [facility].   She provided considerable education to others about the [facility] program and what to expect.  Type of Therapy:  Process Group with Motivational Interviewing  Participation Level:  Active  Participation Quality:  Attentive, Sharing and Supportive  Affect:  Appropriate  Cognitive:  Appropriate  Insight:  Developing/Improving  Engagement in Therapy:  Engaged  Modes of Intervention:   Education, Support and Processing, Activity  Pilgrim's PrideMareida Grossman-Orr, LCSW 05/03/2013, 12:15pm

## 2013-05-04 ENCOUNTER — Encounter (HOSPITAL_COMMUNITY): Payer: Self-pay | Admitting: Emergency Medicine

## 2013-05-04 DIAGNOSIS — F39 Unspecified mood [affective] disorder: Secondary | ICD-10-CM

## 2013-05-04 MED ORDER — TRAMADOL HCL 50 MG PO TABS
50.0000 mg | ORAL_TABLET | Freq: Four times a day (QID) | ORAL | Status: DC | PRN
  Administered 2013-05-05 (×2): 50 mg via ORAL
  Filled 2013-05-04 (×2): qty 1

## 2013-05-04 MED ORDER — DIVALPROEX SODIUM ER 250 MG PO TB24
250.0000 mg | ORAL_TABLET | Freq: Two times a day (BID) | ORAL | Status: DC
Start: 2013-05-04 — End: 2013-05-06
  Administered 2013-05-04 – 2013-05-05 (×4): 250 mg via ORAL
  Filled 2013-05-04 (×5): qty 1
  Filled 2013-05-04: qty 28
  Filled 2013-05-04 (×4): qty 1
  Filled 2013-05-04: qty 28
  Filled 2013-05-04: qty 1

## 2013-05-04 MED ORDER — TRAMADOL HCL 50 MG PO TABS: 50.0000 mg | ORAL_TABLET | Freq: Four times a day (QID) | ORAL | Status: DC | PRN

## 2013-05-04 MED ORDER — TRAMADOL HCL 50 MG PO TABS
50.0000 mg | ORAL_TABLET | Freq: Once | ORAL | Status: AC
  Administered 2013-05-04: 50 mg via ORAL
  Filled 2013-05-04: qty 1

## 2013-05-04 NOTE — Progress Notes (Signed)
D:[facility] with c/o toothache pain. Pt to get referral [facility]. Pt mood is labile and intrusive with fixation on toothache pain. Pt rates her depression as an 8 on 1-10 scale with 10 being the most depressed. She rates hopelessness as a 6. A:Offered support and 15 minute checks. Offered prn ibuprofen for tooth ache and pt refused.  R:Pt denies si and hi. Safety maintained on the unit.

## 2013-05-04 NOTE — Tx Team (Signed)
Interdisciplinary Treatment Plan Update (Adult)  Date: 05/04/2013  Time Reviewed:  9:45 AM  Progress in Treatment: Attending groups: Yes Participating in groups:  Yes Taking medication as prescribed:  Yes Tolerating medication:  Yes Family/Significant othe contact made: No, N/A Patient understands diagnosis:  Yes Discussing patient identified problems/goals with staff:  Yes Medical problems stabilized or resolved:  Yes Denies suicidal/homicidal ideation: Yes Issues/concerns per patient self-inventory:  Yes Other:  New problem(s) identified: N/A  Discharge Plan or Barriers: [facility].    Reason for Continuation of Hospitalization: Anxiety Depression Medication Stabilization  Comments: N/A  Estimated length of stay: 2 days  For review of initial/current patient goals, please see plan of care.  Attendees: Patient:     Family:     Physician:  Dr. Javier GlazierJohnalagadda 05/04/2013 10:41 AM   Nursing:   [place] Minersonna Shimp, RN 05/04/2013 10:41 AM   Clinical Social Worker:  Reyes Ivanhelsea Horton, LCSW 05/04/2013 10:41 AM   Other: Claudette Headonrad Withrow, PA 05/04/2013 10:41 AM   Other:  Sherrye PayorValerie Noch, care coordination 05/04/2013 10:41 AM   Other:  Juline PatchQuylle Hodnett, LCSW 05/04/2013 10:41 AM   Other:  Manuela SchwartzJennifer Pritchett, RN 05/04/2013 10:42 AM   Other: Waynetta SandyJan Wright, RN 05/04/2013 10:42 AM   Other:    Other:    Other:    Other:    Other:     Scribe for Treatment Team:   Carmina MillerHorton, Brendon Christoffel Nicole, 05/04/2013 10:41 AM

## 2013-05-04 NOTE — Progress Notes (Signed)
Adult Psychoeducational Group Note  Date:  05/04/2013 Time:  10:00 am  Group Topic/Focus:  Wellness Toolbox:   The focus of this group is to discuss various aspects of wellness, balancing those aspects and exploring ways to increase the ability to experience wellness.  Patients will create a wellness toolbox for use upon discharge.  Participation Level:  Active  Participation Quality:  Appropriate and Sharing  Affect:  Appropriate  Cognitive:  Appropriate  Insight: Appropriate  Engagement in Group:  Engaged  Modes of Intervention:  Discussion, Education, Socialization and Support  Additional Comments:  Pt. Stated that by listening to music and exercising she can improve her overall heath and wellness. Pt stated that the barriers in her life are depression and family issues.   Lynnda Wiersma 05/04/2013, 10:29 AM
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
## 2013-05-04 NOTE — Discharge Instructions (Signed)
Dental Pain Toothache is pain in or around a tooth. It may get worse with chewing or with cold or heat.  HOME CARE  Your dentist may use a numbing medicine during treatment. If so, you may need to avoid eating until the medicine wears off. Ask your dentist about this.  Only take medicine as told by your dentist or doctor.  Avoid chewing food near the painful tooth until after all treatment is done. Ask your dentist about this. GET HELP RIGHT AWAY IF:   The problem gets worse or new problems appear.  You have a fever.  There is redness and puffiness (swelling) of the face, jaw, or neck.  You cannot open your mouth.  There is pain in the jaw.  There is very bad pain that is not helped by medicine. MAKE SURE YOU:   Understand these instructions.  Will watch your condition.  Will get help right away if you are not doing well or get worse. Document Released: 08/08/2007 Document Revised: 05/14/2011 Document Reviewed: 08/08/2007 [facility] Patient Information 2014 [place], [place].  RESOURCE GUIDE  Chronic Pain Problems: Contact [facility]  [phone] Patients need to be referred by their primary care doctor.  Insufficient Money for Medicine: Contact [employer]:  call "211" or [place] [phone].  No Primary Care Doctor: Call Health Connect  [phone] - can help you locate a primary care doctor that  accepts your insurance, provides certain services, etc. Physician Referral Service- [phone]  Agencies that provide inexpensive medical care: [facility]  [facility]  [phone] [facility]  [phone] [facility]  [phone] Planned Parenthood  [phone] [facility]  [phone]  [place] Providers: [facility]- [address], [address]  [phone], Mon-Fri 9am-7pm, Sat 9am-1pm [place], [address] [place]  [place], [address] [place]  [place]- [address]  [place], [address], [phone]  Only accepts [place] Access [place] patients after they have their name  applied to their card  Self Pay (no insurance) in [facility]: Sickle Cell Patients: Dr Kevan Ny, [facility]  [place], [facility]- [place]  [facility] [facility] [place]- [address], [facility]- see information above (Speak to [employer] if you do not have insurance)       -  [facility]- [place], [place],  [place]       -  [address], [phone]       -  Dr Vista Lawman-  [address], [address], [place], [place] [facility]- [address] [phone]       -  [facility]- [address] [place], [place], also [address] [phone]       -    [facility]- [address], [place], 1st & 3rd Saturday   every month, 10am-1pm  1) Find a Tax adviser and  Pay Out of [place]ocket Although you won't have to find out who is covered by your insurance plan, it is a good idea to ask around and get recommendations. You will then need to call the office and see if the doctor you have chosen will accept you as a new patient and what types of options they offer for patients who are self-pay. Some doctors offer discounts or will set up payment plans for their patients who do not have insurance, but you will need to ask so you aren't surprised when you get to your appointment.  2) Contact Your Local Health Department Not all health departments have doctors that can see patients for sick visits, but many do, so it is worth a call to see if yours  does. If you don't know where your local health department is, you can check in your phone book. The CDC also has a tool to help you locate your state's health department, and many state websites also have listings of all of their local health departments.  3) Find a [facility] If your illness is not likely to be very severe or complicated, you may want to try a walk in clinic. These are popping up all over the country in pharmacies, drugstores, and shopping centers. They're usually staffed by nurse practitioners or physician assistants that have been trained to treat common illnesses and complaints. They're usually fairly quick and inexpensive. However, if you have serious medical issues or chronic medical problems, these are probably not your best option  [facility] [place], [facility], [address] [place], phone [phone] or [phone].  Monday - Friday, call for an appointment. [place], [facility] Clinic, [address], [place], phone [phone] or [phone].  Monday - Friday, call for an appointment.  Abuse/Neglect: [place] [phone] [place] [phone] (After Hours)  Emergency Shelter:  [facility] [phone]  Maternity Homes: Room at the [place] [phone] [place] [phone]  MRSA Hotline #:   9136728096  [facility] [place] Dept. [address]         [place] Phone:  [phone]                                  Phone:  [phone]                   Phone:  [place], [place] in [place], [address],  [phone], [place] [phone] or [phone] (After Hours)   [place]  Substance Abuse Resources: Alcohol and Drug Services  [place] [phone] [facility] [place] Chinita Pester [phone] Residential & Outpatient Substance Abuse Program  [phone]  Psychological Services: [place]  [phone] [place]  [place], [place] [address] [place], [place]: [phone] or [phone], Pic[employer]uy  Dental Assistance  If unable to pay or uninsured, contact:  [facility] or [facility] to become qualified for the adult dental clinic.  Patients with Medicaid: [facility] [phone] [address] [place], [place] [address] [phone]  If unable to pay, or uninsured, contact [facility] [phone]) or [place] [phone] in [place], [place] in [facility]) to become qualified for the adult dental clinic  Other [place]- [place], [place], [place], [address], [place], [place], 2nd and 4th Thursday of the month at 6:30am.  10 clients each day by appointment, can sometimes see walk-in patients if someone does not show for an appointment. [facility]- [address]ank[address] [place], [place], [address], [place], [place], [place], [address], [place] [facility] [phone]  Please make every effort to establish with a primary care  physician for routine medical care  [place]  The [place] provides a wide range of adult health services. Some of these services are designed to address the healthcare needs of all [facility] residents and all services are designed to meet the needs of uninsured/underinsured low income residents. Some services are available to any resident of [place], call [phone] for details. ] The [facility], a new medical clinic for adults, is now open. For more information about the Center and its services please call [phone]. For information on our [place] services, click here.  For more information on any of the following Department of Public Health programs, including hours of service, click on the highlighted link.  SERVICES FOR WOMEN (Adults and Teens) [employer] provide a full range of birth control options plus education and counseling. New patient visit and annual return visits include a complete examination, pap test as indicated, and other laboratory as indicated. Included is our [employer] for men.  Maternity Care is provided through pregnancy, including a six week post partum exam. Women who meet eligibility criteria for the Medicaid for Pregnant Women program, receive care free. Other women are charged on a sliding scale according to income. Note: [facility] provides services to pregnant women who have a Medicaid card. Call [phone] for an appointment in [place] or [phone] for an appointment in [facility].  Primary Care for Medicaid [place] Access Women is available through the [place]. As primary care provider for the [place] program, women may designate the [facility] clinic as their primary care provider.  PLEASE CALL [phone] FOR AN APPOINTMENT FOR THE ABOVE SERVICES  IN EITHER [place] OR [place]. Information available in [place] and [place].   Childbirth Education Classes are open to the public and offered to help families prepare for the best possible childbirth experience as well as to promote lifelong health and wellness. Classes are offered  throughout the year and meet on the same night once a week for five weeks. Medicaid covers the cost of the classes for the mother-to-be and her partner. For participants without Medicaid, the cost of the class series is $45.00 for the mother-to-be and her partner. Class size is limited and registration is required. For more information or to register call [phone]. Baby items donated by [employer] and the [employer] [place] are given away during each class series.  SERVICES FOR WOMEN AND MEN Sexually Transmitted Infection appointments, including HIV testing, are available daily (weekdays, except holidays). Call early as same-day appointments are limited. For an appointment in either [facility] or [place], call [phone]. Services are confidential and free of charge.  Skin Testing for Tuberculosis Please call [phone]. Adult Immunizations are available, usually for a fee. Please call [phone] for details.  PLEASE CALL [phone] FOR AN APPOINTMENT FOR THE ABOVE SERVICES IN EITHER [place] OR [place].   [facility] provides up to the minute recommended vaccines for your travel destination. We also provide essential health and political information to help insure a safe and pleasurable travel experience. This program is self-sustaining, however, fees are very competitive. We are a CERTIFIED YELLOW FEVER IMMUNIZATION approved clinic site. PLEASE CALL [phone] FOR AN APPOINTMENT IN EITHER [place] OR [place].   If you have questions about the services listed above, we want to answer them! Email [place] at: [email or web address] Home Visiting Services for elderly and the disabled are  available to residents of [facility] who are in need of care that compares to the care offered by a nursing home, have needs that can be met by the program, and have [place] Medicaid. Other short term services are available to residents 18 years and older who are unable to meet requirements for eligibility to receive services from a certified home health agency, spend the majority of time at home, and need care for six months or less.  PLEASE CALL [id number] OR [phone] FOR MORE INFORMATION. Medication Assistance Program serves as a link between pharmaceutical companies and patients to provide low cost or free prescription medications. This servce is available for residents who meet certain income restrictions and have no insurance coverage.  PLEASE CALL [phone] () OR [phone] ([place]) FOR MORE INFORMATION.  Updated Feb. 21, 2013

## 2013-05-04 NOTE — Progress Notes (Signed)
Pt is being sent to Dr Lucky CowboyKnox DDS for a 1500 appointment [facility]. [place] called and staff escorted for appointment.

## 2013-05-04 NOTE — Progress Notes (Signed)
[facility]  05/04/2013 3:31 PM Thermon Leylandshley XXXBoone  MRN:  [id number] Subjective:  [name] [name] is dealing with a tooth ache. She was transported to the ED as per recommendation and a referal done to a dentist who can see her today. She had been focused on the pain taking the focus out of what she is here for. [facility] to continue to work on her addiction. States she did well on Depakote and she was started on Tegretol. States she would rather be on Depakote as she knows it works for her  Diagnosis:   DSM5: Schizophrenia Disorders:  denies Obsessive-Compulsive Disorders:  denies Trauma-Stressor Disorders:  Posttraumatic Stress Disorder (309.81) Substance/Addictive Disorders:  Alcohol Related Disorder - Severe (303.90), Cocaine related disorder Depressive Disorders:  Major Depressive Disorder - Moderate (296.22) Total Time spent with patient: 30 minutes  Axis I: Mood Disorder NOS  ADL's:  Intact  Sleep: Fair  Appetite:  Poor  Suicidal Ideation:  Plan:  denies Intent:  denies Means:  denies Homicidal Ideation:  Plan:  denies Intent:  denies Means:  denies AEB (as evidenced by):  Psychiatric Specialty Exam: Physical Exam  Review of Systems  Constitutional: Negative.   HENT:       Tooth ache  Eyes: Negative.   Respiratory: Negative.   Cardiovascular: Negative.   Gastrointestinal: Negative.   Genitourinary: Negative.   Musculoskeletal: Negative.   Skin: Negative.   Neurological: Negative.   Endo/Heme/Allergies: Negative.   Psychiatric/Behavioral: Positive for substance abuse. The patient is nervous/anxious and has insomnia.     Blood pressure 144/90, pulse 96, temperature 98.3 F (36.8 C), temperature source Oral, resp. rate 16, height 5\' 8"  (1.727 m), weight 104.781 kg (231 lb), last menstrual period 04/30/2013, SpO2 95.00%, not currently breastfeeding.Body mass index is 35.13 kg/(m^2).  General Appearance: Fairly Groomed  Patent attorneyye Contact::   Fair  Speech:  Clear and Coherent  Volume:  fluctuates  Mood:  Anxious, Irritable and in pain  Affect:  Appropriate  Thought Process:  Coherent and Goal Directed  Orientation:  Full (Time, Place, and Person)  Thought Content:  symtpoms, worries, concerns  Suicidal Thoughts:  No  Homicidal Thoughts:  No  Memory:  Immediate;   Fair Recent;   Fair Remote;   Fair  Judgement:  Fair  Insight:  Present and Shallow  Psychomotor Activity:  Restlessness  Concentration:  Fair  Recall:  FiservFair  Fund of Knowledge:NA  Language: Fair  Akathisia:  No  Handed:    AIMS (if indicated):     Assets:  Desire for Improvement  Sleep:  Number of Hours: 6.25   Musculoskeletal: Strength & Muscle Tone: within normal limits Gait & Station: normal Patient leans: N/A  Current Medications: Current Facility-Administered Medications  Medication Dose Route Frequency Provider Last Rate Last Dose  . acetaminophen (TYLENOL) tablet 650 mg  650 mg Oral Q6H PRN Beau FannyJohn C Withrow, FNP   650 mg at 05/04/13 [id number]  . alum & mag hydroxide-simeth (MAALOX/MYLANTA) 200-200-20 MG/5ML suspension 30 mL  30 mL Oral Q4H PRN Beau FannyJohn C Withrow, FNP   30 mL at 05/02/13 1952  . amoxicillin (AMOXIL) capsule 500 mg  500 mg Oral 3 times per day Truman Haywardakia S Starkes, FNP   500 mg at 05/04/13 1341  . benzocaine (ORAJEL) 10 % mucosal gel   Mouth/Throat QID PRN Kristeen MansFran E Hobson, NP      . Melene Muller[START ON 05/05/2013] chlordiazePOXIDE (LIBRIUM) capsule 25 mg  25 mg Oral  Daily Beau Fanny, FNP      . citalopram (CELEXA) tablet 20 mg  20 mg Oral Daily Truman Hayward, FNP   20 mg at 05/04/13 0813  . divalproex (DEPAKOTE ER) 24 hr tablet 250 mg  250 mg Oral BID Rachael Fee, MD   250 mg at 05/04/13 1105  . ibuprofen (ADVIL,MOTRIN) tablet 600 mg  600 mg Oral Q6H PRN Rachael Fee, MD   600 mg at 05/03/13 2025  . lisinopril (PRINIVIL,ZESTRIL) tablet 10 mg  10 mg Oral Daily Sanjuana Kava, NP   10 mg at 05/04/13 1610  . magnesium hydroxide (MILK OF MAGNESIA)  suspension 30 mL  30 mL Oral Daily PRN Beau Fanny, FNP      . multivitamin with minerals tablet 1 tablet  1 tablet Oral Daily Beau Fanny, FNP   1 tablet at 05/04/13 [phone]  . nicotine (NICODERM CQ - dosed in mg/24 hours) patch 21 mg  21 mg Transdermal Daily Rachael Fee, MD   21 mg at 05/04/13 5409  . thiamine (VITAMIN B-1) tablet 100 mg  100 mg Oral Daily Beau Fanny, FNP   100 mg at 05/04/13 8119  . traMADol (ULTRAM) tablet 50 mg  50 mg Oral Q6H PRN Rachael Fee, MD      . traZODone (DESYREL) tablet 50 mg  50 mg Oral QHS PRN Beau Fanny, FNP   50 mg at 05/02/13 2157  . triamcinolone cream (KENALOG) 0.1 %   Topical BID Truman Hayward, FNP      . trimethoprim-polymyxin b (POLYTRIM) ophthalmic solution 2 drop  2 drop Both Eyes 6 times per day Beau Fanny, FNP   2 drop at 05/04/13 1105    Lab Results:  Results for orders placed during the hospital encounter of 04/30/13 (from the past 48 hour(s))  CARBAMAZEPINE LEVEL, TOTAL     Status: None   Collection Time    05/03/13  6:37 AM      Result Value Ref Range   Carbamazepine Lvl 4.2  4.0 - 12.0 ug/mL   Comment: Performed at [facility]    Physical Findings: AIMS: Facial and Oral Movements Muscles of Facial Expression: None, normal Lips and Perioral Area: None, normal Jaw: None, normal Tongue: None, normal,Extremity Movements Upper (arms, wrists, hands, fingers): None, normal Lower (legs, knees, ankles, toes): None, normal, Trunk Movements Neck, shoulders, hips: None, normal, Overall Severity Severity of abnormal movements (highest score from questions above): None, normal Incapacitation due to abnormal movements: None, normal Patient's awareness of abnormal movements (rate only patient's report): No Awareness, Dental Status Current problems with teeth and/or dentures?: No Does patient usually wear dentures?: No  CIWA:  CIWA-Ar Total: 1 COWS:  COWS Total Score: 2  Treatment Plan Summary: Daily contact with  patient to assess and evaluate symptoms and progress in treatment Medication management  Plan: Supportive approach/coping skills/relapse prevention           Address the dental pain/ED/dentist           optimize treatment with psychotropics           D/C the Tegretol            Resume Depakote ER 250 mg BID   Medical Decision Making Problem Points:  Established problem, worsening (2) and Review of psycho-social stressors (1) Data Points:  Review of medication regiment & side effects (2) Review of new medications or change in dosage (2)  I certify that inpatient services furnished can reasonably be expected to improve the patient's condition.   Diva Lemberger A 05/04/2013, 3:31 PM

## 2013-05-04 NOTE — Progress Notes (Signed)
D: Patient in bed asleep on first approach.  Patient did wake up to come to medication window for bedtime medications.  Patient states she is happy she was able to get her tooth pulled today.  Patient states her anxiety and depression has decreased.  Patient states she has learned to mind her own business and she needs to worry about herself.  Patient states now that her tooth has been pulled she feels no pain.  Patient denies SI/HI and denies AVH.  Patient states [facility].  Patient states she wants to get her life together.   A: Staff to monitor Q 15 mins for safety.  Encouragement and support offered.  Scheduled medications administered per orders.  Trazodone administered prn for sleep. R: Patient remains safe on the unit.  Patient did not attend group tonight.  Patient taking administered medications.  Patient not visible on the unit tonight.
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
## 2013-05-04 NOTE — Progress Notes (Signed)
Adult Psychoeducational Group Note  Date:  05/04/2013 Time: 8:00 PM  Group Topic/Focus:  AA  Participation Level:  Did Not Attend  Participation Quality:    Affect:    Cognitive:    Insight:   Engagement in Group:    Modes of Intervention:    Additional Comments: Pt did not attend.   Humberto SealsWhitaker, Leocadio Heal Monique 05/04/2013, 10:55 PM

## 2013-05-04 NOTE — ED Provider Notes (Signed)
Medical screening examination/treatment/procedure(s) were performed by non-physician practitioner and as supervising physician I was immediately available for consultation/collaboration.   EKG Interpretation None        Christy Friede, MD 05/04/13 1515 

## 2013-05-04 NOTE — ED Notes (Signed)
[facility], right lower side. Sts pain x 2 months got much worse now.

## 2013-05-05 DIAGNOSIS — F1020 Alcohol dependence, uncomplicated: Principal | ICD-10-CM

## 2013-05-05 DIAGNOSIS — F1420 Cocaine dependence, uncomplicated: Secondary | ICD-10-CM

## 2013-05-05 MED ORDER — ACETAMINOPHEN 325 MG PO TABS: 650.0000 mg | ORAL_TABLET | Freq: Four times a day (QID) | ORAL | Status: DC | PRN

## 2013-05-05 MED ORDER — CITALOPRAM HYDROBROMIDE 20 MG PO TABS: 20.0000 mg | ORAL_TABLET | Freq: Every day | ORAL | Status: DC

## 2013-05-05 MED ORDER — AMOXICILLIN 500 MG PO CAPS: 500.0000 mg | ORAL_CAPSULE | Freq: Three times a day (TID) | ORAL | Status: DC

## 2013-05-05 MED ORDER — IBUPROFEN 200 MG PO TABS: 400.0000 mg | ORAL_TABLET | Freq: Four times a day (QID) | ORAL | Status: DC | PRN

## 2013-05-05 MED ORDER — DIVALPROEX SODIUM ER 250 MG PO TB24
250.0000 mg | ORAL_TABLET | Freq: Two times a day (BID) | ORAL | Status: DC
Start: 2013-05-05 — End: 2013-09-12

## 2013-05-05 MED ORDER — POLYMYXIN B-TRIMETHOPRIM 10000-0.1 UNIT/ML-% OP SOLN: 2.0000 [drp] | OPHTHALMIC | Status: DC

## 2013-05-05 MED ORDER — TRAZODONE HCL 50 MG PO TABS: 50.0000 mg | ORAL_TABLET | Freq: Every evening | ORAL | Status: DC | PRN

## 2013-05-05 MED ORDER — BENZOCAINE 10 % MT GEL: Freq: Four times a day (QID) | OROMUCOSAL | Status: DC | PRN

## 2013-05-05 MED ORDER — TRAMADOL HCL 50 MG PO TABS: 50.0000 mg | ORAL_TABLET | Freq: Four times a day (QID) | ORAL | Status: DC | PRN

## 2013-05-05 MED ORDER — TRIAMCINOLONE ACETONIDE 0.1 % EX CREA: TOPICAL_CREAM | Freq: Two times a day (BID) | CUTANEOUS | Status: DC
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
## 2013-05-05 NOTE — Progress Notes (Signed)
Recreation Therapy Notes  Animal-Assisted Activity/Therapy (AAA/T) Program Checklist/Progress Notes Patient Eligibility Criteria Checklist & Daily Group note for Rec Tx Intervention  Date: 03.03.2015 Time: 2:45pm [address]    AAA/T Program Assumption of Risk Form signed by Patient/ or Parent Legal Guardian yes  Patient is free of allergies or sever asthma yes  Patient reports no fear of animals yes  Patient reports no history of cruelty to animals yes   Patient understands his/her participation is voluntary yes  Patient washes hands before animal contact yes  Patient washes hands after animal contact yes  Behavioral Response: Engaged, Attentive, Appropriate   Education: Hand Washing, Appropriate Animal Interaction   Education Outcome: Acknowledges understanding   Clinical Observations/Feedback: Patient actively engaged with therapy dog, patient pet dog and smiled while doing so. Patient asked appropriate questions about therapy dog. Patient interacted with peer, LRT and dog team appropriately.   Marykay Lexenise L Hermenia Fritcher, LRT/CTRS  Jearl KlinefelterBlanchfield, Laini Urick L 05/05/2013 4:42 PM

## 2013-05-05 NOTE — Progress Notes (Signed)
D: Patient denies SI/HI and auditory and visual hallucinations. The patient has a labile mood and affect. The patient tends to use loud, pressured speech on the unit. The patient needs frequent nursing interventions (PRN meds, discharge questions, medication concerns, requests to see other treatment team members) and is seen at nursing station constantly throughout the shift.   A: Patient given emotional support from RN. Patient encouraged to come to staff with concerns and/or questions. Patient's medication routine continued. Patient's orders and plan of care reviewed.  R: Patient remains cooperative. Will continue to monitor patient q15 minutes for safety.
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
## 2013-05-05 NOTE — [facility] Group Notes (Signed)
[facility] LCSW Group Therapy  05/05/2013  1:15 PM   Type of Therapy:  Group Therapy  Participation Level:  Active  Participation Quality:  Attentive, Sharing and Supportive  Affect:  Depressed and Flat  Cognitive:  Alert and Oriented  Insight:  Developing/Improving and Engaged  Engagement in Therapy:  Developing/Improving and Engaged  Modes of Intervention:  Activity, Clarification, Confrontation, Discussion, Education, Exploration, Limit-setting, Orientation, Problem-solving, Rapport Building, Reality Testing, Socialization and Support  Summary of Progress/Problems:Patient was attentive and engaged with speaker from [employer].  Patient was attentive to speaker while they shared their story of dealing with mental health and overcoming it.  Patient expressed interest in their programs and services and received information on their agency.  Patient processed ways they can relate to the speaker.     Dashonna Chagnon Horton, LCSW 05/05/2013 2:33 PM   

## 2013-05-05 NOTE — Progress Notes (Signed)
[facility] MD Progress Note  05/05/2013 4:41 PM [name] [name]  MRN:  [id number] Subjective:  [name] [name] has been feeling better since her tooth was extracted. She states she is committed to abstinence. Looking forward to going to [facility] in the morning. She is still feeling anxious, and admits she has a tendency to worry. Feels that the Celexa and the Depakote are working for her. Plans to pursue them further. Diagnosis:   DSM5: Schizophrenia Disorders:  none Obsessive-Compulsive Disorders:  none Trauma-Stressor Disorders:  Posttraumatic Stress Disorder (309.81) Substance/Addictive Disorders:  Alcohol Related Disorder - Moderate (303.90)  Cocaine related disorder moderate Depressive Disorders:  Major Depressive Disorder - Moderate (296.22) Total Time spent with patient: 30 minutes  Axis I: Substance Induced Mood Disorder  ADL's:  Intact  Sleep: Fair  Appetite:  Fair  Suicidal Ideation:  Plan:  denies Intent:  denies Means:  denies Homicidal Ideation:  Plan:  denies Intent:  denies Means:  denies AEB (as evidenced by):  Psychiatric Specialty Exam: Physical Exam  Review of Systems  Constitutional: Negative.   HENT: Negative.   Eyes: Negative.   Respiratory: Negative.   Cardiovascular: Negative.   Gastrointestinal: Negative.   Genitourinary: Negative.   Musculoskeletal: Negative.   Skin: Negative.   Neurological: Negative.   Endo/Heme/Allergies: Negative.   Psychiatric/Behavioral: Positive for depression and substance abuse. The patient is nervous/anxious.     Blood pressure 129/82, pulse 103, temperature 97.8 F (36.6 C), temperature source Oral, resp. rate 20, height 5\' 8"  (1.727 m), weight 104.781 kg (231 lb), last menstrual period 04/30/2013, SpO2 95.00%, not currently breastfeeding.Body mass index is 35.13 kg/(m^2).  General Appearance: Fairly Groomed  Patent attorney::  Fair  Speech:  Clear and Coherent  Volume:  Normal  Mood:  Anxious and worried  Affect:  anxious, worried   Thought Process:  Coherent and Goal Directed  Orientation:  Full (Time, Place, and Person)  Thought Content:  symtpoms, worries, concerns  Suicidal Thoughts:  No  Homicidal Thoughts:  No  Memory:  Immediate;   Fair Recent;   Fair Remote;   Fair  Judgement:  Fair  Insight:  Present  Psychomotor Activity:  Restlessness  Concentration:  Fair  Recall:  [employer] of Knowledge:NA  Language: Fair  Akathisia:  No  Handed:    AIMS (if indicated):     Assets:  Desire for Improvement  Sleep:  Number of Hours: 6.5   Musculoskeletal: Strength & Muscle Tone: within normal limits Gait & Station: normal Patient leans: N/A  Current Medications: Current Facility-Administered Medications  Medication Dose Route Frequency Provider Last Rate Last Dose  . acetaminophen (TYLENOL) tablet 650 mg  650 mg Oral Q6H PRN Beau Fanny, FNP   650 mg at 05/04/13 4098  . alum & mag hydroxide-simeth (MAALOX/MYLANTA) 200-200-20 MG/5ML suspension 30 mL  30 mL Oral Q4H PRN Beau Fanny, FNP   30 mL at 05/05/13 0841  . amoxicillin (AMOXIL) capsule 500 mg  500 mg Oral 3 times per day Truman Hayward, FNP   500 mg at 05/05/13 1515  . benzocaine (ORAJEL) 10 % mucosal gel   Mouth/Throat QID PRN Kristeen Mans, NP      . citalopram (CELEXA) tablet 20 mg  20 mg Oral Daily Truman Hayward, FNP   20 mg at 05/05/13 0753  . divalproex (DEPAKOTE ER) 24 hr tablet 250 mg  250 mg Oral BID Rachael Fee, MD   250 mg at 05/05/13 0753  . ibuprofen (ADVIL,MOTRIN) tablet  600 mg  600 mg Oral Q6H PRN Rachael FeeIrving A Kalvyn Desa, MD   600 mg at 05/03/13 2025  . magnesium hydroxide (MILK OF MAGNESIA) suspension 30 mL  30 mL Oral Daily PRN Beau FannyJohn C Withrow, FNP      . multivitamin with minerals tablet 1 tablet  1 tablet Oral Daily Beau FannyJohn C Withrow, FNP   1 tablet at 05/05/13 0753  . nicotine (NICODERM CQ - dosed in mg/24 hours) patch 21 mg  21 mg Transdermal Daily Rachael FeeIrving A Corneshia Hines, MD   21 mg at 05/05/13 0800  . thiamine (VITAMIN B-1) tablet 100 mg  100  mg Oral Daily Beau FannyJohn C Withrow, FNP      . traMADol Janean Sark(ULTRAM) tablet 50 mg  50 mg Oral Q6H PRN Rachael FeeIrving A Britania Shreeve, MD   50 mg at 05/05/13 1520  . traZODone (DESYREL) tablet 50 mg  50 mg Oral QHS PRN Beau FannyJohn C Withrow, FNP   50 mg at 05/04/13 2233  . triamcinolone cream (KENALOG) 0.1 %   Topical BID Truman Haywardakia S Starkes, FNP      . trimethoprim-polymyxin b (POLYTRIM) ophthalmic solution 2 drop  2 drop Both Eyes 6 times per day Beau FannyJohn C Withrow, FNP   2 drop at 05/05/13 1157    Lab Results: No results found for this or any previous visit (from the past 48 hour(s)).  Physical Findings: AIMS: Facial and Oral Movements Muscles of Facial Expression: None, normal Lips and Perioral Area: None, normal Jaw: None, normal Tongue: None, normal,Extremity Movements Upper (arms, wrists, hands, fingers): None, normal Lower (legs, knees, ankles, toes): None, normal, Trunk Movements Neck, shoulders, hips: None, normal, Overall Severity Severity of abnormal movements (highest score from questions above): None, normal Incapacitation due to abnormal movements: None, normal Patient's awareness of abnormal movements (rate only patient's report): No Awareness, Dental Status Current problems with teeth and/or dentures?: No Does patient usually wear dentures?: No  CIWA:  CIWA-Ar Total: 1 COWS:  COWS Total Score: 2  Treatment Plan Summary: Daily contact with patient to assess and evaluate symptoms and progress in treatment Medication management  Plan: Supportive approach/coping skills/relapse prevention           [facility]  Medical Decision Making Problem Points:  Review of psycho-social stressors (1) Data Points:  Review of medication regiment & side effects (2)  I certify that inpatient services furnished can reasonably be expected to improve the patient's condition.   Kiegan Macaraeg A 05/05/2013, 4:41 PM

## 2013-05-05 NOTE — Progress Notes (Signed)
D: Patient in the dayroom on approach.  Patient states she had a good day.  Patient states she is on several medications now but states she feels they are working.  Patient states [facility].  Patient states her ultimate goal is to get into some type of long term treatment.  Patient denies SI/HI and denies AVH.   A: Staff to monitor Q 15 mins for safety.  Encouragement and support offered.  Scheduled medications administered per orders. R: Patient remains safe on the unit.  Patient attended group tonight.  Patient visible on the unit and interacting with peers.  Patient taking administered medications.

## 2013-05-05 NOTE — Progress Notes (Signed)
(  Discharging 6:30 AM tomorrow, 05/06/13) [facility] Adult Case Management Discharge Plan :  Will you be returning to the same living situation after discharge: No. Pt not returning to the place she was staying at and is going straight to treatment.   At discharge, do you have transportation home?:Yes,  [facility] Do you have the ability to pay for your medications:Yes,  provided pt with samples and prescriptions and [facility] with [facility]s  Release of information consent forms completed and in the chart;  Patient's signature needed at discharge.  Patient to Follow up at: Follow-up Information   [facility]. (Arrive at 8:00 am promptly for screening and possible admission.  Bring all belongings, photo ID and 30 day supply of meds.)    Contact information:   [address] [place], [place] [address] Phone: [phone] Fax: [phone]      [facility]. (Walk in on this date for hospital discharge appointment. Walk in clinic is Monday - Friday 8 am - 3 pm. They will than schedule you for medication management and therapy)    Contact information:   [address] [place], [place] [address] Phone: [phone] Fax: [phone]      Schedule an appointment as soon as possible for a visit with Baldo AshKNOX,ROBERT JOSEPH, DDS.   Specialty:  Dentistry   Contact information:   [facility] Bea[facility]ACKL[facility] [address], [address] [place] [address] [phone]       Follow up with Bradly ChrisFARLESS, GRAHAM E, DDS.   Specialty:  Dentistry   Contact information:   [address] [place] [address] [phone]       [facility] with [facility].   Specialty:  Dentistry   Contact information:   [address] [id number] [place] [address] [phone]      Patient denies SI/HI:   Yes,  denies SI/HI     Safety Planning and Suicide Prevention discussed:  Yes,  discussed with pt.  Pt refused consent to contact family/friend.  See suicide prevention education note.   Carmina MillerHorton, Jermia Rigsby Nicole 05/05/2013, 2:34 PM

## 2013-05-05 NOTE — Progress Notes (Signed)
Adult Psychoeducational Group Note  Date:  05/05/2013 Time:  9:46 PM  Group Topic/Focus:  Wrap-Up Group:   The focus of this group is to help patients review their daily goal of treatment and discuss progress on daily workbooks.  Participation Level:  Active  Participation Quality:  Appropriate  Affect:  Appropriate  Cognitive:  Alert and Oriented  Insight: Appropriate  Engagement in Group:  Developing/Improving  Modes of Intervention:  Clarification, Exploration and Support  Additional Comments:  Patient stated that she had an awesome day. Patient stated that she learned from her groups that you can do it if you want to, you just have to be willing to do it.  Sura Canul, Randal Bubaerri Lee 05/05/2013, 9:46 PM

## 2013-05-05 NOTE — Progress Notes (Deleted)
Recreation Therapy Notes  Animal-Assisted Activity/Therapy (AAA/T) Program Checklist/Progress Notes Patient Eligibility Criteria Checklist & Daily Group note for Rec Tx Intervention  Date: 03.03.2015 Time: 11:05am [facility] [address] [facility]    AAA/T Program Assumption of Risk Form signed by Patient/ or Parent Legal Guardian yes  Patient is free of allergies or sever asthma yes  Patient reports no fear of animals yes  Patient reports no history of cruelty to animals yes   Patient understands his/her participation is voluntary yes  Behavioral Response: DID NOT ATTEND  Jearl KlinefelterDenise L [name], LRT/CTRS  Jearl KlinefelterBlanchfield, Teri Legacy L 05/05/2013 4:41 PM

## 2013-05-06 NOTE — Progress Notes (Signed)
Patient discharged at this time.  AVS, RX explained to patient and patient verbalized understanding.  Patient belongings given to patient from locker 31.  Patient denies SI/HI 42and denies AVH.  Patient [facility].

## 2013-05-08 ENCOUNTER — Emergency Department (HOSPITAL_BASED_OUTPATIENT_CLINIC_OR_DEPARTMENT_OTHER)
Admission: EM | Admit: 2013-05-08 | Discharge: 2013-05-08 | Disposition: A | Discharge status: Home / Self Care | Payer: Medicaid Other | Attending: Emergency Medicine | Admitting: Emergency Medicine

## 2013-05-08 ENCOUNTER — Encounter (HOSPITAL_BASED_OUTPATIENT_CLINIC_OR_DEPARTMENT_OTHER): Payer: Self-pay | Admitting: Emergency Medicine

## 2013-05-08 DIAGNOSIS — Z79899 Other long term (current) drug therapy: Secondary | ICD-10-CM | POA: Insufficient documentation

## 2013-05-08 DIAGNOSIS — F17200 Nicotine dependence, unspecified, uncomplicated: Secondary | ICD-10-CM | POA: Insufficient documentation

## 2013-05-08 DIAGNOSIS — L259 Unspecified contact dermatitis, unspecified cause: Secondary | ICD-10-CM | POA: Insufficient documentation

## 2013-05-08 DIAGNOSIS — F329 Major depressive disorder, single episode, unspecified: Secondary | ICD-10-CM | POA: Insufficient documentation

## 2013-05-08 DIAGNOSIS — L309 Dermatitis, unspecified: Secondary | ICD-10-CM

## 2013-05-08 DIAGNOSIS — S0450XA Injury of facial nerve, unspecified side, initial encounter: Secondary | ICD-10-CM | POA: Insufficient documentation

## 2013-05-08 DIAGNOSIS — Z792 Long term (current) use of antibiotics: Secondary | ICD-10-CM | POA: Insufficient documentation

## 2013-05-08 DIAGNOSIS — T148XXA Other injury of unspecified body region, initial encounter: Secondary | ICD-10-CM

## 2013-05-08 DIAGNOSIS — F3289 Other specified depressive episodes: Secondary | ICD-10-CM | POA: Insufficient documentation

## 2013-05-08 DIAGNOSIS — F411 Generalized anxiety disorder: Secondary | ICD-10-CM | POA: Insufficient documentation

## 2013-05-08 DIAGNOSIS — IMO0002 Reserved for concepts with insufficient information to code with codable children: Secondary | ICD-10-CM | POA: Insufficient documentation

## 2013-05-08 MED ORDER — TRIAMCINOLONE ACETONIDE 0.1 % EX CREA: TOPICAL_CREAM | Freq: Two times a day (BID) | CUTANEOUS | Status: DC
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
## 2013-05-08 NOTE — Discharge Instructions (Signed)
°Emergency Department Resource Guide °1) Find a Doctor and Pay Out of Pocket °Although you won't have to find out who is covered by your insurance plan, it is a good idea to ask around and get recommendations. You will then need to call the office and see if the doctor you have chosen will accept you as a new patient and what types of options they offer for patients who are self-pay. Some doctors offer discounts or will set up payment plans for their patients who do not have insurance, but you will need to ask so you aren't surprised when you get to your appointment. ° °2) Contact Your Local Health Department °Not all health departments have doctors that can see patients for sick visits, but many do, so it is worth a call to see if yours does. If you don't know where your local health department is, you can check in your phone book. The CDC also has a tool to help you locate your state's health department, and many state websites also have listings of all of their local health departments. ° °3) Find a Walk-in Clinic °If your illness is not likely to be very severe or complicated, you may want to try a walk in clinic. These are popping up all over the country in pharmacies, drugstores, and shopping centers. They're usually staffed by nurse practitioners or physician assistants that have been trained to treat common illnesses and complaints. They're usually fairly quick and inexpensive. However, if you have serious medical issues or chronic medical problems, these are probably not your best option. ° °No Primary Care Doctor: °- Call Health Connect at  832-8000 - they can help you locate a primary care doctor that  accepts your insurance, provides certain services, etc. °- Physician Referral Service- 1-800-533-3463 ° °Chronic Pain Problems: °Organization         Address  Phone   Notes  °[facility]  (336) 297-2271 Patients need to be referred by their primary care doctor.  ° °Medication  Assistance: °Organization         Address  Phone   Notes  °[place] [facility] [address] °[place], [place] [address] (336) 641-8030 --Must be a resident of [place] °-- Must have NO insurance coverage whatsoever (no Medicaid/ Medicare, etc.) °-- The pt. MUST have a primary care doctor that directs their care regularly and follows them in the community °  °[facility]  (866) 331-1348   °[employer]  (888) 892-1162   ° °Agencies that provide inexpensive medical care: °Organization         Address  Phone   Notes  °[facility]  (336) 832-8035   °[facility]    (336) 832-7272   °[facility] [address] °[place], [place] [address] (336) 832-4777   °[facility] [address], °[place] (336) 271-4999   °[facility]    (336) 373-0678   °[facility]    (336) 272-1050   °[facility] ° [address], [place] Phone:  (336) 832-4444, Fax:  (336) 832-4440 Hours of Operation:  9 am - 6 pm, M-F.  Also accepts Medicaid/Medicare and self-pay.  °[facility] ° [address], [place] Phone: (336) 832-3150, Fax: (336) 832-3151. Hours of Operation:  8:30 am - 5:30 pm, M-F.  Also accepts Medicaid and self-pay.  °[facility] [place] [address], [place] Phone: (336) 878-6027   °[facility] [address], [place], [place] (336)723-1848, Ext. 123 Mondays & Thursdays: 7-9 AM.  First 15 patients are seen on a first come, first serve basis. °  ° °Medicaid-accepting [place] Providers: ° °Organization         Address  Phone   Notes  °[facility] [address], [place] (336) 641-2100 Also accepts self-pay patients.  °[facility] [address], [place] ° (336) 856-9996   °[facility] [address], [place]  (336) 288-8857   °[facility] [address] [place] [address], [place] (336) 299-7000   °Veita Bland [address], [place]  ° (336) 373-1557 Only accepts [place] Access Medicaid patients after they have their name applied to their card.  ° °Self-Pay (no insurance) in [place]: ° °Organization         Address  Phone   Notes  °Sickle Cell Patients, [facility] [address], [place] (336) 832-1970   °[facility] [address], [place] (336) 832-4400   °[facility] ° [address], [place] (336) 992-4800   °[facility]/Dr. Osei-Bonsu ° [address] [place] [address], [place] or [address], [place] (336) 841-8500 Phone number for both [place] and [place] locations is the same.  °[facility] [address], [place] (336) 299-0000   °[facility] [address] [place] [address], [place] or [address] (336) 852-7530 °(336) 878-2260   °[facility] [address], [place] (336) 350-1642, phone; (336) 294-5005, fax Sees patients 1st and 3rd Saturday of every month.  Must not qualify for public or private insurance (i.e. Medicaid, Medicare, [place] Health Choice, Veterans' Benefits) • Household income should be no more than 200% of the poverty level •The clinic cannot treat you if you are pregnant or think you are pregnant • Sexually transmitted diseases are not treated at the clinic.  ° ° °Dental Care: °Organization         Address  Phone  Notes  °[place] [facility] [address],  (336) 641-6152 Accepts children up to age 21 who are enrolled in Medicaid or [place] Health Choice; pregnant women with a Medicaid card; and children who have applied for Medicaid or [place] Health Choice, but were declined, whose parents can pay a reduced fee at time of service.  °[place]  [facility] [place]  [address], [place] (336) 641-7733 Accepts children up to age 21 who are enrolled in Medicaid or [place] Health Choice; pregnant women with a Medicaid card; and children who have applied for Medicaid or [place] Health Choice, but were declined, whose parents can pay a reduced fee at time of service.  °[facility] ° [address],  (336) 641-4533 Patients are seen by appointment only. Walk-ins are not accepted. [facility] will see patients 18 years of age and older. °Monday - Tuesday (8am-5pm) °Most Wednesdays (8:30-5pm) °$30 per visit, cash only  °[facility] ° [address], [place] (336) 641-4533 Patients are seen by appointment only. Walk-ins are not accepted. [facility] will see patients 18 years of age and older. °One   Wednesday Evening (Monthly: Volunteer Based).  $30 per visit, cash only  °[facility]  (919) 537-3737 for adults; Children under age 4, call Graduate Pediatric Dentistry at (919) 537-3956. Children aged 4-14, please call (919) 537-3737 to request a pediatric application. ° Dental services are provided in all areas of dental care including fillings, crowns and bridges, complete and partial dentures, implants, gum treatment, root canals, and extractions. Preventive care is also provided. Treatment is provided to both adults and children. °Patients are selected via a lottery and there is often a waiting list. °  °[facility] [address], °[place] ° (336) 763-8833 [email or web address].[email or web address] °  °[facility] [address], [place], [place] (336)723-1848, Ext. 123 Second and Fourth Thursday of each month, opens at 6:30 AM; Clinic ends at 9 AM.  Patients are seen on a first-come first-served basis, and a limited number are seen during each clinic.  ° °[facility] ° [address], [place], [place] (336) 723-7904    Eligibility Requirements °You must have lived in [place], [place], or [place] counties for at least the last three months. °  You cannot be eligible for state or federal sponsored healthcare insurance, including Veterans Administration, Medicaid, or Medicare. °  You generally cannot be eligible for healthcare insurance through your employer.  °  How to apply: °Eligibility screenings are held every Tuesday and Wednesday afternoon from 1:00 pm until 4:00 pm. You do not need an appointment for the interview!  °[facility] [address], [place], [place] 336-631-2330   °[place] [facility]  336-342-8273   °[place]  336-703-3100   °[facility]  336-570-6415   ° °Behavioral Health Resources in the Community: °Intensive Outpatient Programs °Organization         Address  Phone  Notes  °[facility] [address], [place], [place] 336-878-6098   °[facility] [address], [place], [place] 336-832-9800   °[facility] [address], [place], [place] ° 336-882-2125   °[facility] [address],  °[place], [place] 1-800-853-5163 or 336-641-4981   °Substance Abuse Resources °Organization         Address  Phone  Notes  °[facility]  336-882-2125   °[facility]  336-784-9470   °[facility]  336-285-9073   °[facility]  336-845-3988   °Residential & Outpatient Substance Abuse Program  1-800-659-3381   °Psychological Services °Organization         Address  Phone  Notes  °[facility]  336- 832-9600   °[facility]  336- 378-7881   °[facility] [address], [place] 1-800-853-5163 or 336-641-4981   ° °Mobile Crisis Teams °Organization         Address  Phone  Notes  °[facility], Mobile Crisis Care Unit  1-877-626-1772   °Assertive °[facility] ° [address]  [place], [place] 336-834-9664   °Sharon DeEsch [address] °[place] 336-554-5454   ° °Self-Help/Support Groups °Organization         Address  Phone             Notes  °[facility] [place] - variety of support groups  336- 373-1402 Call for more information  °Narcotics Anonymous (NA), [facility] [address], °[place]   2 meetings at this location  ° °  Residential Treatment Programs °Organization         Address  Phone  Notes  °[facility] [address],    °[place]  1-866-801-8205   °[facility] ° [address], [place], [place] 704-293-8524   °[facility] [address], [place] 336-845-3988 Admissions: 8am-3pm M-F  °[facility] [address]    °[place], [place] 336-841-1104   °[facility] [address], [place], [place] 336-379-7146   °[facility] [address]  °[place], [place] 336-285-9073   °[facility] Intensive Outpatient [address], [place], [place] 336-852-3033   °[facility] [address]  °[place], [place] 1-877-615-2722 or 336-784-9470   °[facility] ([facility] [address] [place], [place] 336-227-7417 Accepts Medicaid  °[facility] [address]  ° [place] 1-800-659-3381 Substance Abuse/Addiction Treatment  ° °[place] Behavioral Health Resources °Organization         Address  Phone  Notes  °[facility]  (888) 581-9988   °Julie Brannon, PhD [address] [place], [place]   (336) 349-5553 or (336) 951-0000   °[facility]   [address] °[place], [place] (336) 349-4454   °[facility] [address], [place], [place] (336) 342-8316 Insurance/Medicaid/sponsorship through [facility]  °[facility] [address]                                    [place], [place] (336) 342-8316 Therapy/tele-psych/case    °[facility] [address]  ° [place], [place] (336) 349-2233    °Dr. Arfeen  (336) 349-4544   °[facility] [place]  [employer] [place] [facility] 1) [address], [place] °2) [address], [place] °3)  [address], [place] (336) 349-3220 °(336) 342-7768 ° °(336) 342-8140   °[place] Child Abuse Hotline (336) 342-1394 or (336) 342-3537 (After Hours)    ° ° °

## 2013-05-11 NOTE — Progress Notes (Signed)
Patient Discharge Instructions:  After Visit Summary (AVS):   Faxed to:  05/11/13 Discharge Summary Note:   Faxed to:  05/11/13 Psychiatric Admission Assessment Note:   Faxed to:  05/11/13 Suicide Risk Assessment - Discharge Assessment:   Faxed to:  05/11/13 Faxed/Sent to the Next Level Care provider:  05/11/13 Faxed to [facility] @ [phone] Faxed to [facility] @ [phone]  Jerelene ReddenSheena E [place], 05/11/2013, 3:01 PM

## 2013-05-14 ENCOUNTER — Ambulatory Visit (INDEPENDENT_AMBULATORY_CARE_PROVIDER_SITE_OTHER): Payer: Self-pay | Admitting: Otolaryngology

## 2013-08-11 IMAGING — US US OB TRANSVAGINAL
1 series · 14 of 28 positions shown · non-contrast
Comparison: None.

CLINICAL DATA: VAGINAL DISCHARGE ABDOMINAL PAIN POSSIBLE
PREGNANCY,pain; ;

OBSTETRIC <14 WK US AND TRANSVAGINAL OB US
TECHNIQUE: Both transabdominal and transvaginal ultrasound
examinations were performed for complete evaluation of the
gestation as well as the maternal uterus, adnexal regions, and
pelvic cul-de-sac.

[Series 1: us ob transvaginal · 0.30mm/px · 14 of 53 slices shown]
[im 2/53]
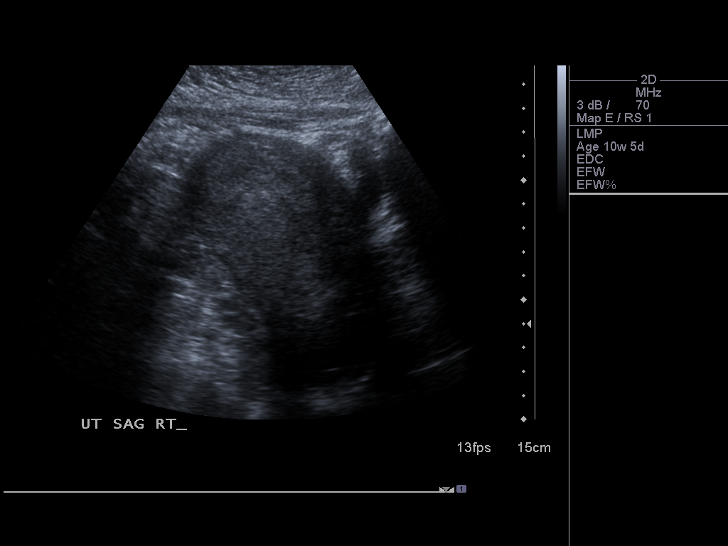
[im 6/53]
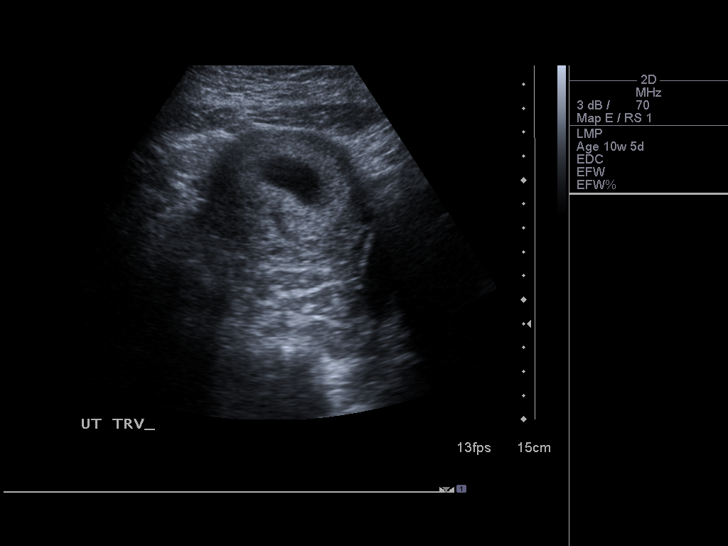
[im 10/53]
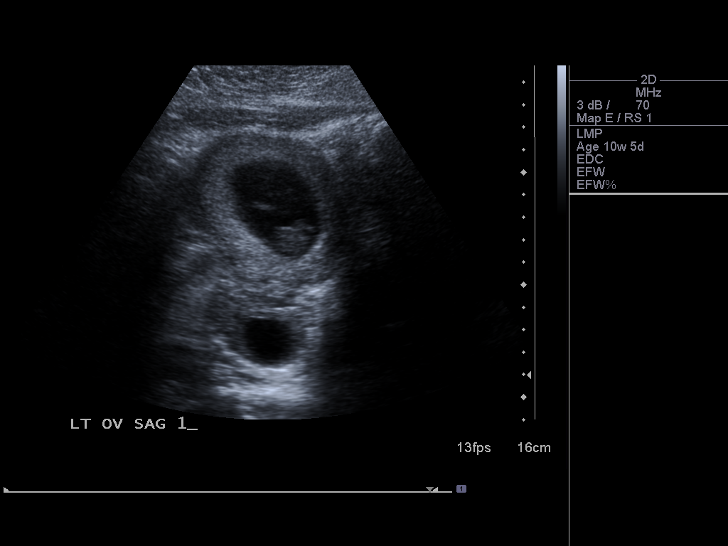
[im 14/53]
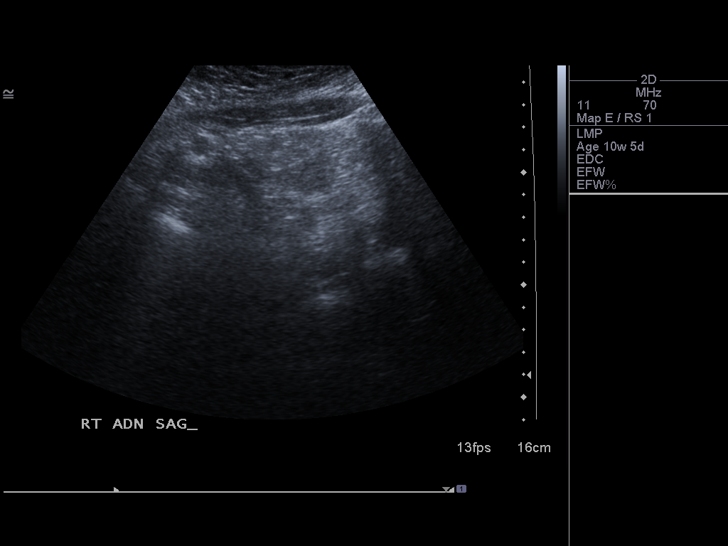
[im 18/53]
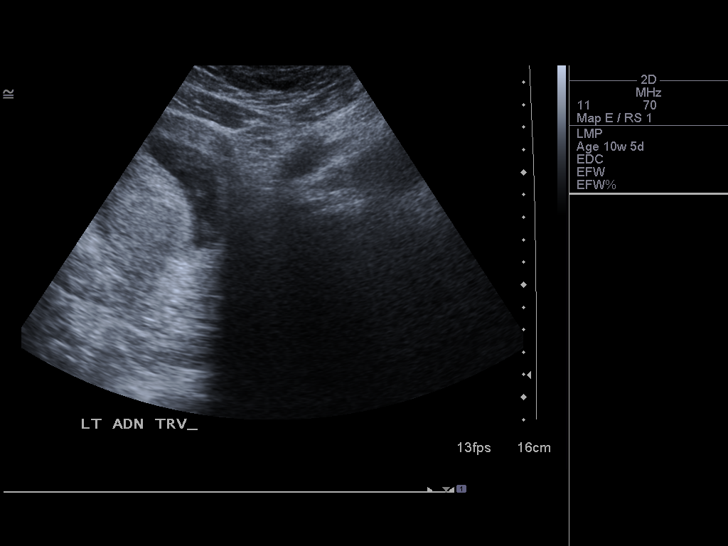
[im 22/53]
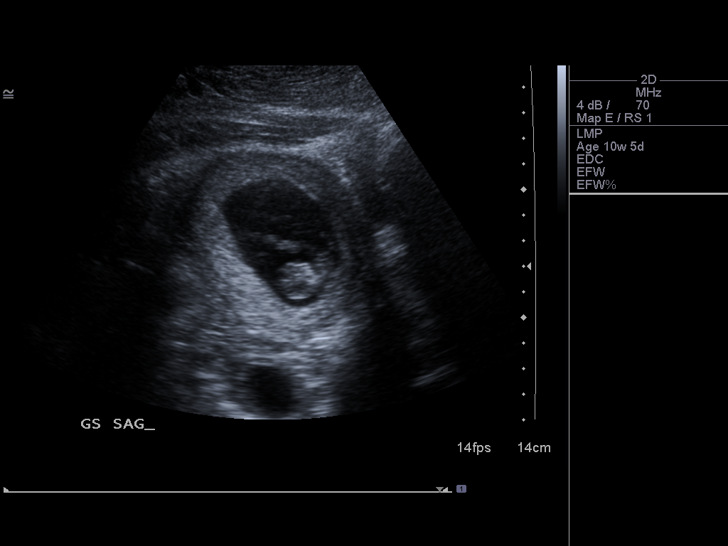
[im 26/53]
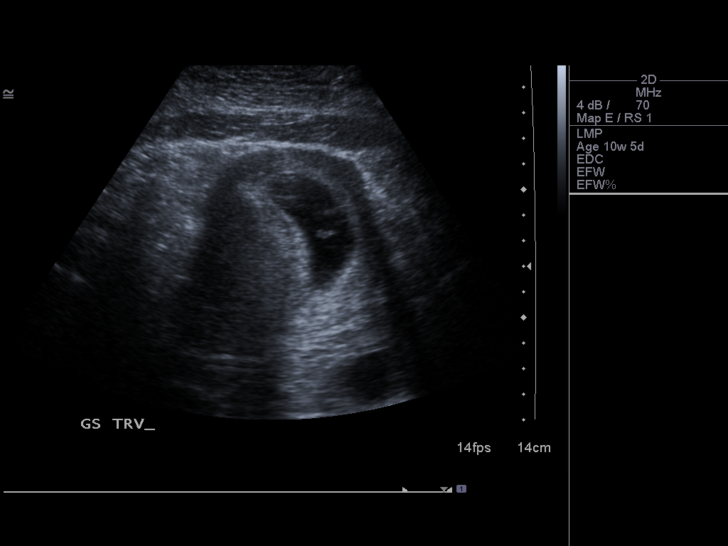
[im 29/53]
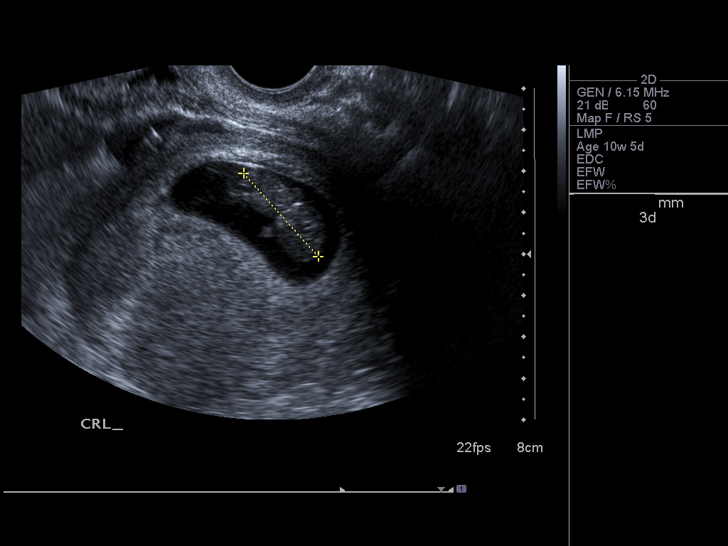
[im 33/53]
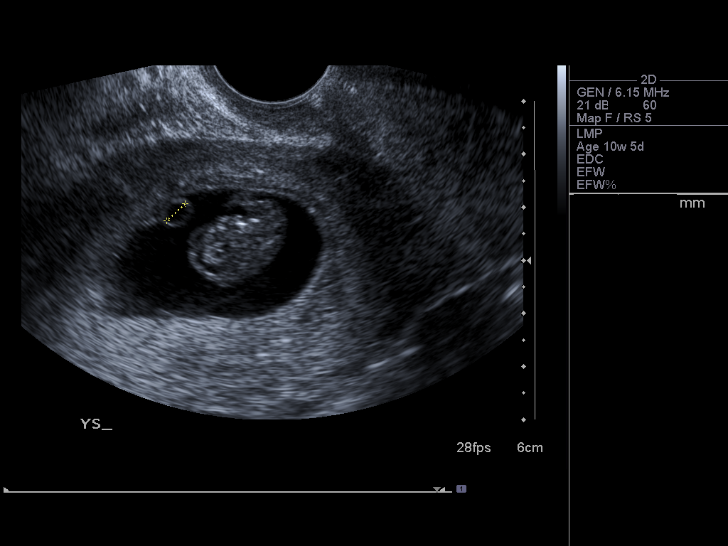
[im 37/53]
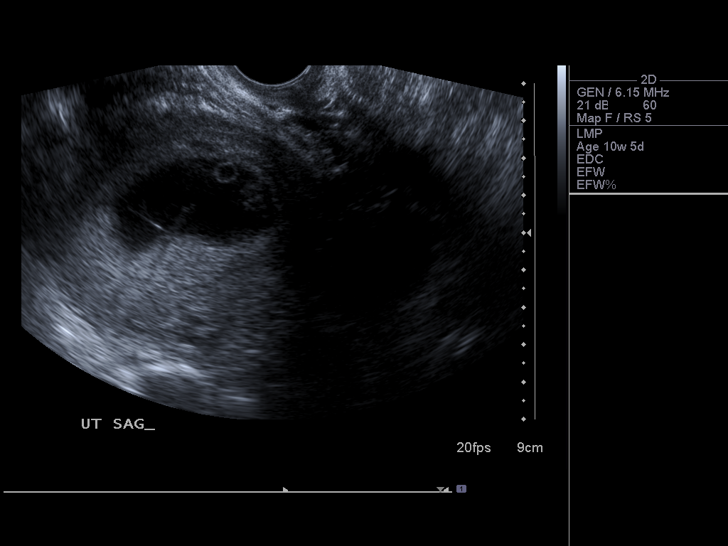
[im 41/53]
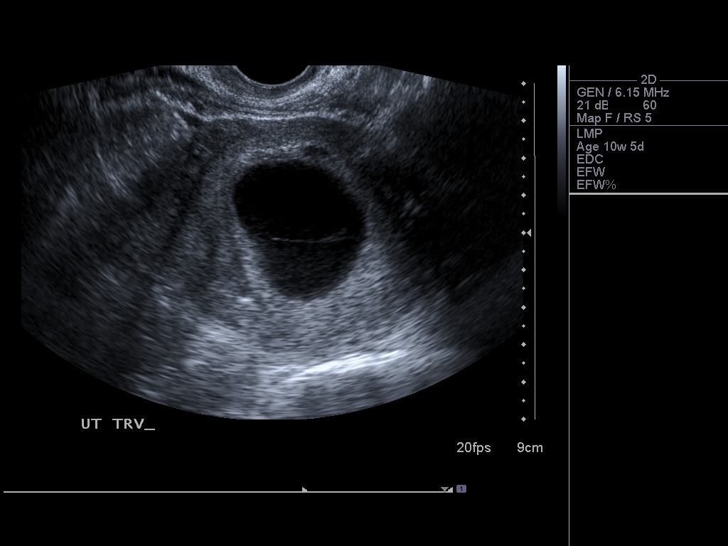
[im 45/53]
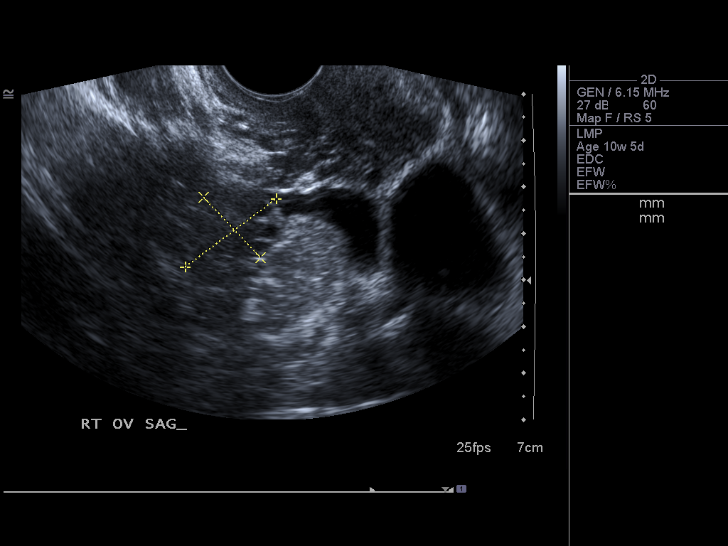
[im 49/53]
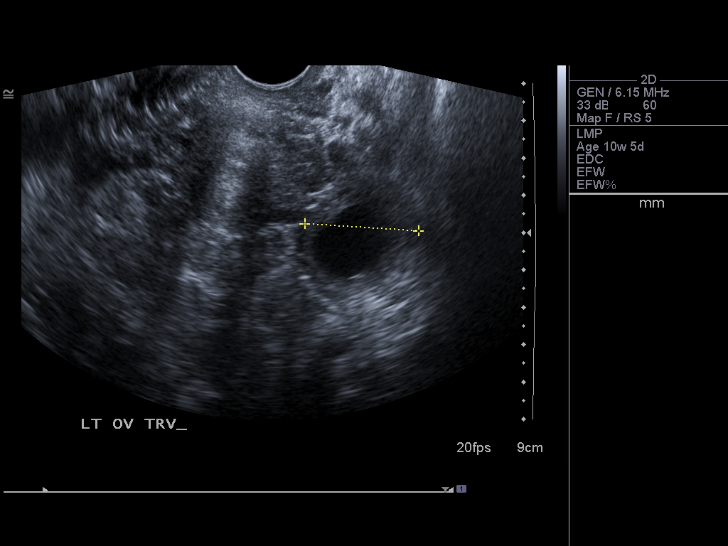
[im 53/53]
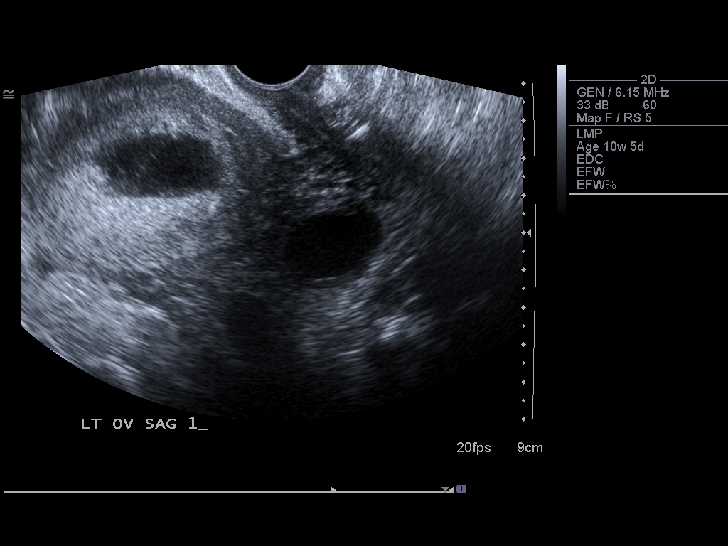

[14 of 28 positions shown; findings below may reference images not displayed]

FINDINGS: There is a single intrauterine gestation, 9 weeks 4 days
by crown-rump length.  Fetal heart rate 168 beats per minute.  No
subchorionic hemorrhage.

Left corpus luteal cyst.  No adnexal masses.  Trace free fluid in
the pelvis.
IMPRESSION: 9-week-7-day intrauterine pregnancy.  Fetal heart rate 168 beats
per minute.

## 2013-09-12 ENCOUNTER — Emergency Department (HOSPITAL_COMMUNITY): Payer: Self-pay

## 2013-09-12 ENCOUNTER — Inpatient Hospital Stay (HOSPITAL_COMMUNITY)
Admission: EM | Admit: 2013-09-12 | Discharge: 2013-09-14 | DRG: 565 | Discharge status: Against Medical Advice | Payer: Self-pay | Attending: General Surgery | Admitting: General Surgery

## 2013-09-12 ENCOUNTER — Encounter (HOSPITAL_COMMUNITY): Payer: Self-pay | Admitting: Radiology

## 2013-09-12 DIAGNOSIS — F17200 Nicotine dependence, unspecified, uncomplicated: Secondary | ICD-10-CM | POA: Diagnosis present

## 2013-09-12 DIAGNOSIS — S2249XA Multiple fractures of ribs, unspecified side, initial encounter for closed fracture: Secondary | ICD-10-CM

## 2013-09-12 DIAGNOSIS — F329 Major depressive disorder, single episode, unspecified: Secondary | ICD-10-CM | POA: Diagnosis present

## 2013-09-12 DIAGNOSIS — S2239XA Fracture of one rib, unspecified side, initial encounter for closed fracture: Secondary | ICD-10-CM | POA: Diagnosis present

## 2013-09-12 DIAGNOSIS — F1010 Alcohol abuse, uncomplicated: Secondary | ICD-10-CM | POA: Diagnosis present

## 2013-09-12 DIAGNOSIS — F4310 Post-traumatic stress disorder, unspecified: Secondary | ICD-10-CM | POA: Diagnosis present

## 2013-09-12 DIAGNOSIS — S0280XA Fracture of other specified skull and facial bones, unspecified side, initial encounter for closed fracture: Principal | ICD-10-CM | POA: Diagnosis present

## 2013-09-12 DIAGNOSIS — S0180XA Unspecified open wound of other part of head, initial encounter: Secondary | ICD-10-CM | POA: Diagnosis present

## 2013-09-12 DIAGNOSIS — S0181XA Laceration without foreign body of other part of head, initial encounter: Secondary | ICD-10-CM

## 2013-09-12 DIAGNOSIS — S0285XA Fracture of orbit, unspecified, initial encounter for closed fracture: Secondary | ICD-10-CM

## 2013-09-12 DIAGNOSIS — S1093XA Contusion of unspecified part of neck, initial encounter: Secondary | ICD-10-CM

## 2013-09-12 DIAGNOSIS — F1410 Cocaine abuse, uncomplicated: Secondary | ICD-10-CM | POA: Diagnosis present

## 2013-09-12 DIAGNOSIS — S0083XA Contusion of other part of head, initial encounter: Secondary | ICD-10-CM | POA: Diagnosis present

## 2013-09-12 DIAGNOSIS — S023XXA Fracture of orbital floor, initial encounter for closed fracture: Secondary | ICD-10-CM

## 2013-09-12 DIAGNOSIS — S0003XA Contusion of scalp, initial encounter: Secondary | ICD-10-CM | POA: Diagnosis present

## 2013-09-12 DIAGNOSIS — E669 Obesity, unspecified: Secondary | ICD-10-CM | POA: Diagnosis present

## 2013-09-12 DIAGNOSIS — F411 Generalized anxiety disorder: Secondary | ICD-10-CM | POA: Diagnosis present

## 2013-09-12 DIAGNOSIS — S0230XA Fracture of orbital floor, unspecified side, initial encounter for closed fracture: Secondary | ICD-10-CM

## 2013-09-12 DIAGNOSIS — Z6837 Body mass index (BMI) 37.0-37.9, adult: Secondary | ICD-10-CM

## 2013-09-12 DIAGNOSIS — F3289 Other specified depressive episodes: Secondary | ICD-10-CM | POA: Diagnosis present

## 2013-09-12 DIAGNOSIS — S2232XA Fracture of one rib, left side, initial encounter for closed fracture: Secondary | ICD-10-CM

## 2013-09-12 LAB — CBC WITH DIFFERENTIAL/PLATELET
BASOS ABS: 0 10*3/uL (ref 0.0–0.1)
Basophils Relative: 0 % (ref 0–1)
EOS ABS: 0 10*3/uL (ref 0.0–0.7)
Eosinophils Relative: 0 % (ref 0–5)
HCT: 37.6 % (ref 36.0–46.0)
Hemoglobin: 13.8 g/dL (ref 12.0–15.0)
LYMPHS ABS: 4.5 10*3/uL — AB (ref 0.7–4.0)
Lymphocytes Relative: 20 % (ref 12–46)
MCH: 29.1 pg (ref 26.0–34.0)
MCHC: 36.7 g/dL — AB (ref 30.0–36.0)
MCV: 79.3 fL (ref 78.0–100.0)
Monocytes Absolute: 1.4 10*3/uL — ABNORMAL HIGH (ref 0.1–1.0)
Monocytes Relative: 6 % (ref 3–12)
Neutro Abs: 16.7 10*3/uL — ABNORMAL HIGH (ref 1.7–7.7)
Neutrophils Relative %: 74 % (ref 43–77)
PLATELETS: 351 10*3/uL (ref 150–400)
RBC: 4.74 MIL/uL (ref 3.87–5.11)
RDW: 13.3 % (ref 11.5–15.5)
WBC: 22.6 10*3/uL — AB (ref 4.0–10.5)

## 2013-09-12 LAB — RAPID URINE DRUG SCREEN, HOSP PERFORMED
Amphetamines: NOT DETECTED
BARBITURATES: NOT DETECTED
Benzodiazepines: NOT DETECTED
Cocaine: POSITIVE — AB
Opiates: NOT DETECTED
Tetrahydrocannabinol: NOT DETECTED

## 2013-09-12 LAB — I-STAT CHEM 8, ED
BUN: 8 mg/dL (ref 6–23)
CALCIUM ION: 1.08 mmol/L — AB (ref 1.12–1.23)
CHLORIDE: 108 meq/L (ref 96–112)
Creatinine, Ser: 1.3 mg/dL — ABNORMAL HIGH (ref 0.50–1.10)
Glucose, Bld: 88 mg/dL (ref 70–99)
HEMATOCRIT: 41 % (ref 36.0–46.0)
HEMOGLOBIN: 13.9 g/dL (ref 12.0–15.0)
Potassium: 3.4 mEq/L — ABNORMAL LOW (ref 3.7–5.3)
Sodium: 143 mEq/L (ref 137–147)
TCO2: 20 mmol/L (ref 0–100)

## 2013-09-12 LAB — PREGNANCY, URINE: Preg Test, Ur: NEGATIVE

## 2013-09-12 LAB — ETHANOL: Alcohol, Ethyl (B): 73 mg/dL — ABNORMAL HIGH (ref 0–11)

## 2013-09-12 MED ORDER — IOHEXOL 300 MG/ML  SOLN
100.0000 mL | Freq: Once | INTRAMUSCULAR | Status: AC | PRN
  Administered 2013-09-12: 100 mL via INTRAVENOUS

## 2013-09-12 MED ORDER — MORPHINE SULFATE 4 MG/ML IJ SOLN
4.0000 mg | Freq: Once | INTRAMUSCULAR | Status: AC
  Administered 2013-09-12: 4 mg via INTRAVENOUS
  Filled 2013-09-12: qty 1

## 2013-09-12 NOTE — ED Notes (Signed)
Patient transported to CT 
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
## 2013-09-13 ENCOUNTER — Encounter (HOSPITAL_COMMUNITY): Payer: Self-pay | Admitting: *Deleted

## 2013-09-13 DIAGNOSIS — F1420 Cocaine dependence, uncomplicated: Secondary | ICD-10-CM

## 2013-09-13 LAB — BASIC METABOLIC PANEL
ANION GAP: 16 — AB (ref 5–15)
BUN: 11 mg/dL (ref 6–23)
CHLORIDE: 102 meq/L (ref 96–112)
CO2: 23 mEq/L (ref 19–32)
Calcium: 8.8 mg/dL (ref 8.4–10.5)
Creatinine, Ser: 0.86 mg/dL (ref 0.50–1.10)
GFR calc non Af Amer: >90 mL/min (ref >90)
Glucose, Bld: 101 mg/dL — ABNORMAL HIGH (ref 70–99)
Potassium: 4 mEq/L (ref 3.7–5.3)
Sodium: 141 mEq/L (ref 137–147)

## 2013-09-13 LAB — CBC
HCT: 36.1 % (ref 36.0–46.0)
Hemoglobin: 12.9 g/dL (ref 12.0–15.0)
MCH: 28.7 pg (ref 26.0–34.0)
MCHC: 35.7 g/dL (ref 30.0–36.0)
MCV: 80.2 fL (ref 78.0–100.0)
PLATELETS: 315 10*3/uL (ref 150–400)
RBC: 4.5 MIL/uL (ref 3.87–5.11)
RDW: 13.5 % (ref 11.5–15.5)
WBC: 13.7 10*3/uL — ABNORMAL HIGH (ref 4.0–10.5)

## 2013-09-13 MED ORDER — MORPHINE SULFATE 2 MG/ML IJ SOLN
2.0000 mg | INTRAMUSCULAR | Status: DC | PRN
  Administered 2013-09-13: 2 mg via INTRAVENOUS
  Administered 2013-09-13: 4 mg via INTRAVENOUS
  Filled 2013-09-13: qty 1
  Filled 2013-09-13: qty 2

## 2013-09-13 MED ORDER — OXYCODONE HCL 5 MG PO TABS
5.0000 mg | ORAL_TABLET | ORAL | Status: DC | PRN
  Administered 2013-09-13 (×2): 10 mg via ORAL
  Administered 2013-09-13: 5 mg via ORAL
  Administered 2013-09-14 (×2): 10 mg via ORAL
  Filled 2013-09-13 (×3): qty 2
  Filled 2013-09-13: qty 1
  Filled 2013-09-13: qty 2

## 2013-09-13 MED ORDER — ONDANSETRON HCL 4 MG/2ML IJ SOLN: 4.0000 mg | Freq: Four times a day (QID) | INTRAMUSCULAR | Status: DC | PRN

## 2013-09-13 MED ORDER — OXYCODONE HCL 5 MG PO TABS
5.0000 mg | ORAL_TABLET | ORAL | Status: DC | PRN
  Administered 2013-09-13: 5 mg via ORAL
  Filled 2013-09-13: qty 1

## 2013-09-13 MED ORDER — HEPARIN SODIUM (PORCINE) 5000 UNIT/ML IJ SOLN
5000.0000 [IU] | Freq: Three times a day (TID) | INTRAMUSCULAR | Status: DC
  Administered 2013-09-13 – 2013-09-14 (×3): 5000 [IU] via SUBCUTANEOUS
  Filled 2013-09-13 (×7): qty 1

## 2013-09-13 MED ORDER — DIPHENHYDRAMINE HCL 50 MG/ML IJ SOLN
25.0000 mg | Freq: Four times a day (QID) | INTRAMUSCULAR | Status: DC | PRN
  Administered 2013-09-13: 25 mg via INTRAVENOUS
  Filled 2013-09-13: qty 1

## 2013-09-13 MED ORDER — ONDANSETRON HCL 4 MG PO TABS: 4.0000 mg | ORAL_TABLET | Freq: Four times a day (QID) | ORAL | Status: DC | PRN

## 2013-09-13 MED ORDER — KCL IN DEXTROSE-NACL 20-5-0.9 MEQ/L-%-% IV SOLN
INTRAVENOUS | Status: DC
  Administered 2013-09-13: 1000 mL via INTRAVENOUS
  Filled 2013-09-13 (×4): qty 1000
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
## 2013-09-14 DIAGNOSIS — S2232XA Fracture of one rib, left side, initial encounter for closed fracture: Secondary | ICD-10-CM

## 2013-09-14 DIAGNOSIS — S0285XA Fracture of orbit, unspecified, initial encounter for closed fracture: Secondary | ICD-10-CM

## 2013-09-14 DIAGNOSIS — S0181XA Laceration without foreign body of other part of head, initial encounter: Secondary | ICD-10-CM

## 2013-09-14 MED ORDER — ENOXAPARIN SODIUM 30 MG/0.3ML ~~LOC~~ SOLN
30.0000 mg | Freq: Two times a day (BID) | SUBCUTANEOUS | Status: DC
  Administered 2013-09-14: 30 mg via SUBCUTANEOUS
  Filled 2013-09-14: qty 0.3

## 2013-09-14 MED ORDER — OXYCODONE HCL 5 MG PO TABS: 10.0000 mg | ORAL_TABLET | ORAL | Status: DC | PRN

## 2013-09-14 MED ORDER — NAPROXEN 250 MG PO TABS
500.0000 mg | ORAL_TABLET | Freq: Two times a day (BID) | ORAL | Status: DC
  Administered 2013-09-14: 500 mg via ORAL
  Filled 2013-09-14: qty 2

## 2013-09-14 MED ORDER — MORPHINE SULFATE 2 MG/ML IJ SOLN: 2.0000 mg | INTRAMUSCULAR | Status: DC | PRN
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
## 2013-09-14 NOTE — Progress Notes (Signed)
UR completed.  Viviano Bir, RN BSN MHA CCM Trauma/Neuro ICU Case Manager [phone]  

## 2013-09-14 NOTE — Progress Notes (Signed)
This patient has been seen and I agree with the findings and treatment plan.  Taysia Rivere O. Conny Situ, III, MD, FACS [phone] (pager) [phone] (direct pager) Trauma Surgeon  
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
## 2013-09-16 NOTE — Procedures (Signed)
Medical screening examination/treatment/procedure(s) were conducted as a shared visit with non-physician practitioner(s) and myself.  I personally evaluated the patient during the encounter.   EKG Interpretation None

## 2013-10-08 ENCOUNTER — Encounter (HOSPITAL_COMMUNITY): Payer: Self-pay | Admitting: Emergency Medicine

## 2013-10-08 ENCOUNTER — Emergency Department (HOSPITAL_COMMUNITY)
Admission: EM | Admit: 2013-10-08 | Discharge: 2013-10-08 | Disposition: A | Discharge status: Other Acute Inpatient Hospital | Payer: Medicaid Other | Attending: Emergency Medicine | Admitting: Emergency Medicine

## 2013-10-08 ENCOUNTER — Observation Stay (HOSPITAL_COMMUNITY)
Admission: RE | Admit: 2013-10-08 | Discharge: 2013-10-09 | Disposition: A | Discharge status: Rehabilitation Facility | Payer: Medicaid Other | Attending: Psychiatry | Admitting: Psychiatry

## 2013-10-08 ENCOUNTER — Encounter (HOSPITAL_COMMUNITY): Payer: Self-pay | Admitting: *Deleted

## 2013-10-08 DIAGNOSIS — F17200 Nicotine dependence, unspecified, uncomplicated: Secondary | ICD-10-CM | POA: Insufficient documentation

## 2013-10-08 DIAGNOSIS — F3289 Other specified depressive episodes: Secondary | ICD-10-CM | POA: Insufficient documentation

## 2013-10-08 DIAGNOSIS — F329 Major depressive disorder, single episode, unspecified: Secondary | ICD-10-CM | POA: Diagnosis present

## 2013-10-08 DIAGNOSIS — F1410 Cocaine abuse, uncomplicated: Secondary | ICD-10-CM | POA: Insufficient documentation

## 2013-10-08 DIAGNOSIS — F1910 Other psychoactive substance abuse, uncomplicated: Secondary | ICD-10-CM

## 2013-10-08 DIAGNOSIS — F1020 Alcohol dependence, uncomplicated: Principal | ICD-10-CM | POA: Insufficient documentation

## 2013-10-08 DIAGNOSIS — F1010 Alcohol abuse, uncomplicated: Secondary | ICD-10-CM | POA: Insufficient documentation

## 2013-10-08 DIAGNOSIS — Z008 Encounter for other general examination: Secondary | ICD-10-CM | POA: Insufficient documentation

## 2013-10-08 DIAGNOSIS — Z8659 Personal history of other mental and behavioral disorders: Secondary | ICD-10-CM | POA: Insufficient documentation

## 2013-10-08 DIAGNOSIS — F32A Depression, unspecified: Secondary | ICD-10-CM | POA: Diagnosis present

## 2013-10-08 LAB — CBC WITH DIFFERENTIAL/PLATELET
BASOS PCT: 0 % (ref 0–1)
Basophils Absolute: 0 10*3/uL (ref 0.0–0.1)
Eosinophils Absolute: 0.2 10*3/uL (ref 0.0–0.7)
Eosinophils Relative: 1 % (ref 0–5)
HEMATOCRIT: 41.5 % (ref 36.0–46.0)
HEMOGLOBIN: 14.7 g/dL (ref 12.0–15.0)
LYMPHS PCT: 23 % (ref 12–46)
Lymphs Abs: 3.5 10*3/uL (ref 0.7–4.0)
MCH: 29.2 pg (ref 26.0–34.0)
MCHC: 35.4 g/dL (ref 30.0–36.0)
MCV: 82.3 fL (ref 78.0–100.0)
MONO ABS: 0.8 10*3/uL (ref 0.1–1.0)
Monocytes Relative: 5 % (ref 3–12)
NEUTROS ABS: 11 10*3/uL — AB (ref 1.7–7.7)
NEUTROS PCT: 71 % (ref 43–77)
Platelets: 299 10*3/uL (ref 150–400)
RBC: 5.04 MIL/uL (ref 3.87–5.11)
RDW: 13.9 % (ref 11.5–15.5)
WBC: 15.5 10*3/uL — AB (ref 4.0–10.5)

## 2013-10-08 LAB — BASIC METABOLIC PANEL
ANION GAP: 18 — AB (ref 5–15)
BUN: 8 mg/dL (ref 6–23)
CHLORIDE: 101 meq/L (ref 96–112)
CO2: 20 meq/L (ref 19–32)
CREATININE: 0.9 mg/dL (ref 0.50–1.10)
Calcium: 9.3 mg/dL (ref 8.4–10.5)
GFR calc non Af Amer: 87 mL/min — ABNORMAL LOW (ref >90)
Glucose, Bld: 145 mg/dL — ABNORMAL HIGH (ref 70–99)
POTASSIUM: 3.7 meq/L (ref 3.7–5.3)
Sodium: 139 mEq/L (ref 137–147)

## 2013-10-08 LAB — ETHANOL: ALCOHOL ETHYL (B): 33 mg/dL — AB (ref 0–11)

## 2013-10-08 MED ORDER — ZOLPIDEM TARTRATE 5 MG PO TABS: 5.0000 mg | ORAL_TABLET | Freq: Every evening | ORAL | Status: DC | PRN

## 2013-10-08 MED ORDER — VITAMIN B-1 100 MG PO TABS
100.0000 mg | ORAL_TABLET | Freq: Every day | ORAL | Status: DC
  Administered 2013-10-09: 100 mg via ORAL
  Filled 2013-10-08 (×3): qty 1

## 2013-10-08 MED ORDER — CHLORDIAZEPOXIDE HCL 25 MG PO CAPS
25.0000 mg | ORAL_CAPSULE | Freq: Four times a day (QID) | ORAL | Status: DC
  Administered 2013-10-08 – 2013-10-09 (×3): 25 mg via ORAL
  Filled 2013-10-08 (×2): qty 1

## 2013-10-08 MED ORDER — IBUPROFEN 200 MG PO TABS: 600.0000 mg | ORAL_TABLET | Freq: Three times a day (TID) | ORAL | Status: DC | PRN

## 2013-10-08 MED ORDER — CHLORDIAZEPOXIDE HCL 25 MG PO CAPS: 25.0000 mg | ORAL_CAPSULE | ORAL | Status: DC

## 2013-10-08 MED ORDER — TRAZODONE HCL 50 MG PO TABS
50.0000 mg | ORAL_TABLET | Freq: Every evening | ORAL | Status: DC | PRN
  Administered 2013-10-08: 50 mg via ORAL
  Filled 2013-10-08: qty 1

## 2013-10-08 MED ORDER — LOPERAMIDE HCL 2 MG PO CAPS: 2.0000 mg | ORAL_CAPSULE | ORAL | Status: DC | PRN

## 2013-10-08 MED ORDER — ADULT MULTIVITAMIN W/MINERALS CH
1.0000 | ORAL_TABLET | Freq: Every day | ORAL | Status: DC
  Administered 2013-10-09: 1 via ORAL
  Filled 2013-10-08 (×3): qty 1

## 2013-10-08 MED ORDER — CHLORDIAZEPOXIDE HCL 25 MG PO CAPS: 25.0000 mg | ORAL_CAPSULE | Freq: Three times a day (TID) | ORAL | Status: DC

## 2013-10-08 MED ORDER — THIAMINE HCL 100 MG/ML IJ SOLN
100.0000 mg | Freq: Once | INTRAMUSCULAR | Status: AC
  Administered 2013-10-08: 100 mg via INTRAMUSCULAR
  Filled 2013-10-08: qty 2

## 2013-10-08 MED ORDER — ALUM & MAG HYDROXIDE-SIMETH 200-200-20 MG/5ML PO SUSP: 30.0000 mL | ORAL | Status: DC | PRN

## 2013-10-08 MED ORDER — MAGNESIUM HYDROXIDE 400 MG/5ML PO SUSP: 30.0000 mL | Freq: Every day | ORAL | Status: DC | PRN

## 2013-10-08 MED ORDER — NICOTINE 21 MG/24HR TD PT24
21.0000 mg | MEDICATED_PATCH | Freq: Every day | TRANSDERMAL | Status: DC
  Administered 2013-10-09: 21 mg via TRANSDERMAL
  Filled 2013-10-08 (×3): qty 1

## 2013-10-08 MED ORDER — CHLORDIAZEPOXIDE HCL 25 MG PO CAPS: 25.0000 mg | ORAL_CAPSULE | Freq: Every day | ORAL | Status: DC

## 2013-10-08 MED ORDER — ONDANSETRON 4 MG PO TBDP: 4.0000 mg | ORAL_TABLET | Freq: Four times a day (QID) | ORAL | Status: DC | PRN

## 2013-10-08 MED ORDER — CHLORDIAZEPOXIDE HCL 25 MG PO CAPS
25.0000 mg | ORAL_CAPSULE | Freq: Four times a day (QID) | ORAL | Status: DC | PRN
  Filled 2013-10-08 (×2): qty 1

## 2013-10-08 MED ORDER — ACETAMINOPHEN 325 MG PO TABS: 650.0000 mg | ORAL_TABLET | ORAL | Status: DC | PRN

## 2013-10-08 MED ORDER — NICOTINE 21 MG/24HR TD PT24: 21.0000 mg | MEDICATED_PATCH | Freq: Every day | TRANSDERMAL | Status: DC | PRN

## 2013-10-08 MED ORDER — HYDROXYZINE HCL 25 MG PO TABS: 25.0000 mg | ORAL_TABLET | Freq: Four times a day (QID) | ORAL | Status: DC | PRN

## 2013-10-08 MED ORDER — ACETAMINOPHEN 325 MG PO TABS: 650.0000 mg | ORAL_TABLET | Freq: Four times a day (QID) | ORAL | Status: DC | PRN

## 2013-10-08 MED ORDER — ONDANSETRON HCL 4 MG PO TABS
4.0000 mg | ORAL_TABLET | Freq: Three times a day (TID) | ORAL | Status: DC | PRN
Start: 2013-10-08 — End: 2013-10-08

## 2013-10-08 NOTE — ED Provider Notes (Signed)
SUTURE REMOVAL Performed by: Junious SilkMerrell, Shenna Brissette  Consent: Verbal consent obtained. Patient identity confirmed: provided demographic data Time out: Immediately prior to procedure a "time out" was called to verify the correct patient, procedure, equipment, support staff and site/side marked as required.  Location details: right eyebrow  Wound Appearance: clean  Sutures/Staples Removed: 1  Facility: sutures placed in this facility Patient tolerance: Patient tolerated the procedure well with no immediate complications.     Mora BellmanHannah S Tamula Morrical, PA-C 10/08/13 1538

## 2013-10-08 NOTE — ED Notes (Signed)
Psychoeducational materials provided. 
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
## 2013-10-08 NOTE — ED Notes (Signed)
[facility]. Pts. Belongings given to [facility] as well.

## 2013-10-08 NOTE — Plan of Care (Signed)
[facility] by Dr. Lolly MustacheArfeen. Accepted to observation bed #1.
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
## 2013-10-08 NOTE — Progress Notes (Signed)
[facility] INPATIENT:  Family/Significant Other Suicide Prevention Education  Suicide Prevention Education:  Education Completed; Spoke with person identified by patient as her support at her permission,  Shanna Cisco(Kevin Brown) has been identified by the patient as the family member/significant other with whom the patient will be residing, and identified as the person(s) who will aid the patient in the event of a mental health crisis (suicidal ideations/suicide attempt).  With written consent from the patient, the family member/significant other has been provided the following suicide prevention education, prior to the and/or following the discharge of the patient.  The suicide prevention education provided includes the following:  Suicide risk factors  Suicide prevention and interventions  National Suicide Hotline telephone number  [facility] assessment telephone number  [facility] Emergency Assistance 911  [place] and/or Residential Mobile Crisis Unit telephone number  Request made of family/significant other to:  Remove weapons (e.g., guns, rifles, knives), all items previously/currently identified as safety concern.    Remove drugs/medications (over-the-counter, prescriptions, illicit drugs), all items previously/currently identified as a safety concern.  The family member/significant other verbalizes understanding of the suicide prevention education information provided.  The family member/significant other agrees to remove the items of safety concern listed above.  [name] [name], [name] [name] [facility] 10/08/2013, 10:15 PM
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
## 2013-10-08 NOTE — ED Notes (Signed)
Report received from Mellon FinancialEric RN. Pt. Alert and oriented in no distress denies SI, HI, AVH and pain. Will continue to monitor for safety. Pt. Instructed to come to me with problems or concerns. Q 15 minute checks continue.

## 2013-10-08 NOTE — ED Notes (Signed)
[facility] for ETOH cocaine.  Pt last use of alcohol was today and cocaine was yesterday. Pt states that she has a stitch above her right eye that needs to be removed, pt states that she pulled out the other stitch but cant get the one out.

## 2013-10-09 ENCOUNTER — Encounter (HOSPITAL_COMMUNITY): Payer: Self-pay | Admitting: Registered Nurse

## 2013-10-09 DIAGNOSIS — F1910 Other psychoactive substance abuse, uncomplicated: Secondary | ICD-10-CM

## 2013-10-09 DIAGNOSIS — F1010 Alcohol abuse, uncomplicated: Secondary | ICD-10-CM

## 2013-10-09 DIAGNOSIS — F3289 Other specified depressive episodes: Secondary | ICD-10-CM

## 2013-10-09 DIAGNOSIS — F329 Major depressive disorder, single episode, unspecified: Secondary | ICD-10-CM

## 2013-10-09 LAB — HEPATIC FUNCTION PANEL
ALBUMIN: 3.7 g/dL (ref 3.5–5.2)
ALK PHOS: 103 U/L (ref 39–117)
ALT: 12 U/L (ref 0–35)
AST: 26 U/L (ref 0–37)
Bilirubin, Direct: <0.2 mg/dL (ref 0.0–0.3)
Total Bilirubin: <0.2 mg/dL — ABNORMAL LOW (ref 0.3–1.2)
Total Protein: 7.4 g/dL (ref 6.0–8.3)

## 2013-10-09 LAB — URINALYSIS, ROUTINE W REFLEX MICROSCOPIC
Bilirubin Urine: NEGATIVE
GLUCOSE, UA: NEGATIVE mg/dL
Hgb urine dipstick: NEGATIVE
KETONES UR: NEGATIVE mg/dL
Leukocytes, UA: NEGATIVE
Nitrite: NEGATIVE
PH: 6.5 (ref 5.0–8.0)
Protein, ur: NEGATIVE mg/dL
Specific Gravity, Urine: 1.023 (ref 1.005–1.030)
Urobilinogen, UA: 0.2 mg/dL (ref 0.0–1.0)

## 2013-10-09 LAB — RAPID URINE DRUG SCREEN, HOSP PERFORMED
Amphetamines: NOT DETECTED
BARBITURATES: NOT DETECTED
BENZODIAZEPINES: POSITIVE — AB
Cocaine: POSITIVE — AB
Opiates: NOT DETECTED
Tetrahydrocannabinol: NOT DETECTED

## 2013-10-09 LAB — PREGNANCY, URINE: PREG TEST UR: NEGATIVE

## 2013-10-09 NOTE — Progress Notes (Signed)
D) Patient is A&Ox4, appropriate behavior for situation. Patient denies SI/HI and A/V hallucinations.  Patient displays poor insight regarding detox.  Patient preoccupied with talking about drinking with another patient.  Patient does not show any signs and denies any signs of withdrawals. Mood and affect depressed but appropriate to situation. A) Support and encouragement given to patient prn. Meds given to patient as scheduled and prn.  Continue monitoring patient closely q15 minutes and prn. R) patient remains safe.  Ibrahem Volkman, Wyman SongsterAngela Marie

## 2013-10-09 NOTE — BH Assessment (Addendum)
[facility] Assessment Progress Note  At 11:45 I called [facility] in an effort to place this pt, leaving a message on voice mail.  At 11:59 [facility] called back reporting that they currently do not have any detox beds [place].  I asked pt if she would be able to arrange [place] own [place] if she were admitted to [facility].  She replied in the negative.  For this reason referral to [facility] was not pursued.  At 11:47 I spoke to PonderNicole at [facility].  She reports that they do have beds available.  Referral has been faxed.  At 12:37 Joni Reiningicole confirms receipt; she is currently reviewing pt.  I have spoken to her several times during the day and she has requested urinalysis, urine pregnancy and urine drug screen results.  At this time they are pending.  Upon completion they are to be faxed to [phone] with follow-up call to [phone].  Doylene Canninghomas Jaquise Faux, MA Triage Specialist 10/09/2013 @ 18:04

## 2013-10-09 NOTE — ED Provider Notes (Signed)
Medical screening examination/treatment/procedure(s) were performed by non-physician practitioner and as supervising physician I was immediately available for consultation/collaboration.   EKG Interpretation None       Elroy Schembri L Verdell Kincannon, MD 10/09/13 2118 

## 2013-10-09 NOTE — Progress Notes (Signed)
Patient ID: [name] [name], female   DOB: 09/09/1985, 28 y.o.   MRN: [id number] Report called to Elana at [facility] [place]. [facility] to [facility] for transport to unit.

## 2013-10-09 NOTE — Discharge Instructions (Signed)
To help you with detoxification from alcohol and cocaine, [facility]  The Observation Unit will arrange for transportation by way of the [place] transportation agency, which will be taking you directly there:       [facility]      [address]      [place], [place] [address]      [phone]

## 2013-10-09 NOTE — [facility] Suicide Risk Assessment (Cosign Needed)
Suicide Risk Assessment  Admission Assessment     Nursing information obtained from:  Patient Demographic factors:  Low socioeconomic status;Unemployed Current Mental Status:  NA Loss Factors:  Financial problems / change in socioeconomic status Historical Factors:  NA Risk Reduction Factors:  NA Total Time spent with patient: 20 minutes  CLINICAL FACTORS:   Alcohol/Substance Abuse/Dependencies  Psychiatric Specialty Exam: Physical Exam   ROS   Blood pressure 135/93, pulse 79, temperature 97.9 F (36.6 C), temperature source Oral, resp. rate 16, height 5\' 9"  (1.753 m), weight 123.378 kg (272 lb), last menstrual period 09/23/2013, SpO2 100.00%, not currently breastfeeding.Body mass index is 40.15 kg/(m^2).   General Appearance: Casual   Eye Contact:: Good   Speech: Clear and Coherent and Normal Rate   Volume: Normal   Mood: Anxious   Affect: Congruent   Thought Process: Circumstantial and Goal Directed   Orientation: Full (Time, Place, and Person)   Thought Content: WDL   Suicidal Thoughts: No   Homicidal Thoughts: No   Memory: Immediate; Good  Recent; Good  Remote; Good   Judgement: Fair   Insight: Fair   Psychomotor Activity: Normal   Concentration: Fair   Recall: Good   Fund of Knowledge:Good   Language: Good   Akathisia: No   Handed: Right   AIMS (if indicated):   Assets: Communication Skills  Desire for Improvement   Sleep:   Musculoskeletal:  Strength & Muscle Tone: within normal limits  Gait & Station: normal  Patient leans: N/A     COGNITIVE FEATURES THAT CONTRIBUTE TO RISK:  None noted    SUICIDE RISK:   Minimal: No identifiable suicidal ideation.  Patients presenting with no risk factors but with morbid ruminations; may be classified as minimal risk based on the severity of the depressive symptoms  PLAN OF CARE: Seek placement in non medical detox/rehab facility.  I certify that inpatient services furnished can reasonably be expected to improve  the patient's condition.  Jemia Fata 10/09/2013, 12:58 PM

## 2013-10-09 NOTE — Plan of Care (Addendum)
[facility] Observation Crisis Plan  Reason for Crisis Plan:  Substance Abuse   Plan of Care:  Referral for non-medical detoxification  Family Support:    No family supports, but pt identifies [facility] as her main social support.  Current Living Environment:  Living Arrangements: Other (Comment) Homeless  Insurance:  Self Pay Hospital Account   Name Acct ID Class Status Primary Coverage   [name] [name], [name] [id number] BEHAVIORAL HEALTH OBSERVATION Open None        Guarantor Account (for Hospital Account [id number])   Name Relation to Pt Service Area Active? Acct Type   [name] [name], [name] Self [facility] Yes Behavioral Health   Address Phone       [address] [place], [place] [id number] [phone](H)          Coverage Information (for Hospital Account [id number])   Not on file      Legal Guardian:   Self  Primary Care Provider:  Default, Provider, MD None  Current Outpatient Providers:  None  Psychiatrist:  Name of Psychiatrist:  (No psychiatrist )None  Counselor/Therapist:  Name of Therapist:  (No therapist )None  Compliant with Medications:  No; Pt has been off of Latuda, Wellbutrin, Abilify, and Trazodone for the past month.  After relapsing, she was discontinued from treatment at [facility].  Additional Information: After consulting with Assunta FoundShuvon Rankin, FNP it has been determined that pt does not present a life threatening danger to herself or others, and that psychiatric hospitalization is not indicated for her at this time.  However, she would benefit from admission to a non-medical detoxification facility.  Failing placement at such a facility, pt is to be given resources and referred to an area homeless shelter.  At 11:59 [name]issa [name].  Pt is now under consideration for admission to [facility].  They have requested additional labs, which are currently being processed.  With  these in place they are optimistic that they will be able to accept her.  [name] Canninghomas Affan Callow, MA Triage Specialist [name] GibneyHughes, [name] [name] 8/7/201512:37 PM  Addendum: After UDS, Urinalysis, and Urine Pregnancy results were returned, they were faxed to [facility].  At 18:35 [name] [name] received them and after examining them agreed to accept pt to their facility with an understanding that she would be responsible for arranging her own transportation back home once treatment was completed.  With pt's approval I spoke to pt's counselor from [facility], [name] [name], who is agreeable arranging pt's transportation back home after treatment.  [name] [name] requested that Observation Unit staff call [facility] at [phone] as she is leaving for their facility.  Observation Unit nurses have been informed.  [name] Canninghomas [name] Balaguer, MA Triage Specialist 10/09/2013 @ 18:46
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
## 2013-10-09 NOTE — Progress Notes (Signed)
Patient ID: [name] [name], female   DOB: [birth year], 28 y.o.   MRN: [id number] Pt transported to [facility] via [facility]; all belongings given to [facility]. No s/s of distress noted. Pt allowed to dress in street clothing. Driver's License sent with [facility] in envelope for admission.

## 2013-10-10 NOTE — Discharge Summary (Signed)
Physician Discharge Summary Note  Patient:  [name] [name] is an 28 y.o., female MRN:  [id number] DOB:  [birth year] Patient phone:  [phone] (home)  Patient address:   [address] [place] [address],  Total Time spent with patient: 15 minutes  Date of Admission:  10/08/2013 Date of Discharge: 10/09/2013  Reason for Admission:  Alcohol detoxification  Discharge Diagnoses: Active Problems:   Depressive disorder   Alcohol Dependence  Psychiatric Specialty Exam: Physical Exam  Nursing note and vitals reviewed. Constitutional: She is oriented to person, place, and time. No distress.  HENT:  Mouth/Throat: No oropharyngeal exudate.  Neck: Normal range of motion.  Cardiovascular: Normal rate.   Respiratory: Effort normal and breath sounds normal.  Musculoskeletal: Normal range of motion.  Neurological: She is alert and oriented to person, place, and time.  Skin: Skin is warm and dry.  Psychiatric: She has a normal mood and affect. Her behavior is normal. Judgment and thought content normal.    Review of Systems  Constitutional: Negative.   HENT: Negative.   Eyes: Negative.   Respiratory: Negative.   Cardiovascular: Negative.   Gastrointestinal: Negative.   Genitourinary: Negative.   Musculoskeletal: Negative.   Skin: Negative.   Neurological: Negative.   Endo/Heme/Allergies: Negative.   Psychiatric/Behavioral: The patient is nervous/anxious.     Blood pressure 119/83, pulse 78, temperature 97.9 F (36.6 C), temperature source Oral, resp. rate 18, height 5\' 9"  (1.753 m), weight 123.378 kg (272 lb), last menstrual period 09/23/2013, SpO2 100.00%, not currently breastfeeding.Body mass index is 40.15 kg/(m^2).  General Appearance: Casual  Eye Contact::  Good  Speech:  Clear and Coherent and Normal Rate  Volume:  Normal  Mood:  Anxious  Affect:  Congruent  Thought Process:  Circumstantial and Goal Directed  Orientation:  Full (Time, Place, and Person)  Thought  Content:  WDL  Suicidal Thoughts:  No  Homicidal Thoughts:  No  Memory:  Immediate;   Good Recent;   Good Remote;   Good  Judgement:  Intact  Insight:  Fair  Psychomotor Activity:  Normal  Concentration:  Fair  Recall:  Good  Fund of Knowledge:Good  Language: Good  Akathisia:  No  Handed:  Right  AIMS (if indicated):     Assets:  Communication Skills Desire for Improvement  Sleep:       Past Psychiatric History: Diagnosis:Alcohol Dependence  Hospitalizations: [facility] 2013 & 2015 and [facility]  Outpatient Care:[facility] in the past; not at present  Substance Abuse Care: [facility]  Self-Mutilation: Denies  Suicidal Attempts: Denies  Violent Behaviors: Denies   Musculoskeletal: Strength & Muscle Tone: within normal limits Gait & Station: normal Patient leans: N/A  DSM5:  Schizophrenia Disorders:   Obsessive-Compulsive Disorders:   Trauma-Stressor Disorders:  PTSD Substance/Addictive Disorders:  Alcohol Related Disorder - Moderate (303.90) Depressive Disorders:    Axis Diagnosis:   AXIS I:  Alcohol Abuse, Depressive Disorder NOS and Cocaine Abuse AXIS II:  Deferred AXIS III:   Past Medical History  Diagnosis Date  . Pregnancy   . Substance abuse   . Normal delivery 02/19/2012  . Anxiety   . Mental disorder   . Depression    AXIS IV:  housing problems, other psychosocial or environmental problems and problems with primary support group AXIS V:  41-50 serious symptoms  Level of Care:  [facility]  Hospital Course:  [name] [name] is an 28 y.o. female with a  history of cocaine abuse, alcohol abuse, and PTSD who presented voluntarily to [facility], accompanied by a staff member of [facility], requesting alcohol detox. She started drinking alcohol at age 61 and has been a daily drinker since.  She has maintained sobriety only when she has received treatment at [facility] and [facility].  She drinks (10) 40oz  beers daily. She also uses crack cocaine daily. She denies SI, HI, and AVH. She does admit to depression and has a prior diagnosis of bipolar disorder.   Patient was admitted to the Observation unit and remained under observation for 22 hours for medication initiation and stabilization.  [name] [name] was started on Librium detoxification protocol and was tolerating it well.  She is currently being discharged to continue alcohol detoxification at [facility] in [place], [place]. At the time of discharge, patient denies suicidal/homicidal ideation, intent or plan and also denies AVH.  Patient will be transported to [facility] by [facility].   Consults:  psychiatry  Significant Diagnostic Studies:  labs: Urine Drug Screen, Urinalysis, Urine Pregnancy, Hepatic Function, CMP, CBC w/diff, ETOH  Discharge Vitals:   Blood pressure 119/83, pulse 78, temperature 97.9 F (36.6 C), temperature source Oral, resp. rate 18, height 5\' 9"  (1.753 m), weight 123.378 kg (272 lb), last menstrual period 09/23/2013, SpO2 100.00%, not currently breastfeeding. Body mass index is 40.15 kg/(m^2). Lab Results:   Results for orders placed during the hospital encounter of 10/08/13 (from the past 72 hour(s))  HEPATIC FUNCTION PANEL     Status: Abnormal   Collection Time    10/09/13  1:50 PM      Result Value Ref Range   Total Protein 7.4  6.0 - 8.3 g/dL   Albumin 3.7  3.5 - 5.2 g/dL   AST 26  0 - 37 U/L   ALT 12  0 - 35 U/L   Alkaline Phosphatase 103  39 - 117 U/L   Total Bilirubin <0.2 (*) 0.3 - 1.2 mg/dL   Bilirubin, Direct <1.6  0.0 - 0.3 mg/dL   Indirect Bilirubin NOT CALCULATED  0.3 - 0.9 mg/dL   Comment: Performed at [facility]  URINALYSIS, ROUTINE W REFLEX MICROSCOPIC     Status: None   Collection Time    10/09/13  3:29 PM      Result Value Ref Range   Color, Urine YELLOW  YELLOW   APPearance CLEAR  CLEAR   Specific Gravity, Urine 1.023  1.005 - 1.030   pH 6.5  5.0 -  8.0   Glucose, UA NEGATIVE  NEGATIVE mg/dL   Hgb urine dipstick NEGATIVE  NEGATIVE   Bilirubin Urine NEGATIVE  NEGATIVE   Ketones, ur NEGATIVE  NEGATIVE mg/dL   Protein, ur NEGATIVE  NEGATIVE mg/dL   Urobilinogen, UA 0.2  0.0 - 1.0 mg/dL   Nitrite NEGATIVE  NEGATIVE   Leukocytes, UA NEGATIVE  NEGATIVE   Comment: MICROSCOPIC NOT DONE ON URINES WITH NEGATIVE PROTEIN, BLOOD, LEUKOCYTES, NITRITE, OR GLUCOSE <1000 mg/dL.     Performed at [facility]  PREGNANCY, URINE     Status: None   Collection Time    10/09/13  3:29 PM      Result Value Ref Range   Preg Test, Ur NEGATIVE  NEGATIVE   Comment:            THE SENSITIVITY OF THIS     METHODOLOGY IS >20 mIU/mL.     Performed at [facility]  URINE RAPID  DRUG SCREEN (HOSP PERFORMED)     Status: Abnormal   Collection Time    10/09/13  5:55 PM      Result Value Ref Range   Opiates NONE DETECTED  NONE DETECTED   Cocaine POSITIVE (*) NONE DETECTED   Benzodiazepines POSITIVE (*) NONE DETECTED   Amphetamines NONE DETECTED  NONE DETECTED   Tetrahydrocannabinol NONE DETECTED  NONE DETECTED   Barbiturates NONE DETECTED  NONE DETECTED   Comment:            DRUG SCREEN FOR MEDICAL PURPOSES     ONLY.  IF CONFIRMATION IS NEEDED     FOR ANY PURPOSE, NOTIFY LAB     WITHIN 5 DAYS.                LOWEST DETECTABLE LIMITS     FOR URINE DRUG SCREEN     Drug Class       Cutoff (ng/mL)     Amphetamine      1000     Barbiturate      200     Benzodiazepine   200     Tricyclics       300     Opiates          300     Cocaine          300     THC              50     Performed at [facility]    Physical Findings: AIMS: Facial and Oral Movements Muscles of Facial Expression: None, normal Lips and Perioral Area: None, normal Jaw: None, normal Tongue: None, normal,Extremity Movements Upper (arms, wrists, hands, fingers): None, normal Lower (legs, knees, ankles, toes): None, normal, Trunk  Movements Neck, shoulders, hips: None, normal, Overall Severity Severity of abnormal movements (highest score from questions above): None, normal Incapacitation due to abnormal movements: None, normal Patient's awareness of abnormal movements (rate only patient's report): No Awareness, Dental Status Current problems with teeth and/or dentures?: No Does patient usually wear dentures?: No  CIWA:  CIWA-Ar Total: 0 COWS:  COWS Total Score: 1  Psychiatric Specialty Exam: See Psychiatric Specialty Exam and Suicide Risk Assessment completed by Attending Physician prior to discharge.  Discharge destination:  [facility]  Is patient on multiple antipsychotic therapies at discharge:  No   Has Patient had three or more failed trials of antipsychotic monotherapy by history:  No  Recommended Plan for Multiple Antipsychotic Therapies: NA     Medication List    Notice   You have not been prescribed any medications.         Follow-up Information   Follow up with [facility] Today.   Contact information:   [facility] [address] [place], [place]  [id number]      Follow-up recommendations:  Activity:  As Tolerated Diet:  Regular Tests:  As needed as determined by [facility] and PCP Other:  N/A  Comments:  Transportation - [facility]  Total Discharge Time:  Less than 30 minutes.  Signed: Alberteen SamFran Annalyssa Thune, FNP-BC 10/10/2013, 3:51 AM

## 2013-10-10 NOTE — Discharge Summary (Signed)
Case discussed, and agree with plan 

## 2013-10-10 NOTE — H&P (Signed)
[facility] needs monitoring[facility]

## 2014-01-04 ENCOUNTER — Encounter (HOSPITAL_COMMUNITY): Payer: Self-pay | Admitting: Registered Nurse

## 2014-07-28 ENCOUNTER — Emergency Department (HOSPITAL_COMMUNITY): Payer: Medicaid Other

## 2014-07-28 ENCOUNTER — Encounter (HOSPITAL_COMMUNITY): Payer: Self-pay | Admitting: *Deleted

## 2014-07-28 ENCOUNTER — Emergency Department (HOSPITAL_COMMUNITY)
Admission: EM | Admit: 2014-07-28 | Discharge: 2014-07-28 | Disposition: A | Discharge status: Home / Self Care | Payer: Medicaid Other | Attending: Emergency Medicine | Admitting: Emergency Medicine

## 2014-07-28 DIAGNOSIS — Y9389 Activity, other specified: Secondary | ICD-10-CM | POA: Insufficient documentation

## 2014-07-28 DIAGNOSIS — Y998 Other external cause status: Secondary | ICD-10-CM | POA: Insufficient documentation

## 2014-07-28 DIAGNOSIS — Y92009 Unspecified place in unspecified non-institutional (private) residence as the place of occurrence of the external cause: Secondary | ICD-10-CM | POA: Insufficient documentation

## 2014-07-28 DIAGNOSIS — Z720 Tobacco use: Secondary | ICD-10-CM | POA: Insufficient documentation

## 2014-07-28 DIAGNOSIS — W108XXA Fall (on) (from) other stairs and steps, initial encounter: Secondary | ICD-10-CM | POA: Insufficient documentation

## 2014-07-28 DIAGNOSIS — M25562 Pain in left knee: Secondary | ICD-10-CM

## 2014-07-28 DIAGNOSIS — S8992XA Unspecified injury of left lower leg, initial encounter: Secondary | ICD-10-CM | POA: Insufficient documentation

## 2014-07-28 DIAGNOSIS — Z8659 Personal history of other mental and behavioral disorders: Secondary | ICD-10-CM | POA: Insufficient documentation

## 2014-07-28 MED ORDER — NAPROXEN 500 MG PO TABS: 500.0000 mg | ORAL_TABLET | Freq: Two times a day (BID) | ORAL | Status: DC

## 2014-07-28 MED ORDER — NAPROXEN 250 MG PO TABS
500.0000 mg | ORAL_TABLET | Freq: Once | ORAL | Status: AC
  Administered 2014-07-28: 500 mg via ORAL
  Filled 2014-07-28: qty 2
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
## 2014-07-28 NOTE — ED Provider Notes (Signed)
CSN: [id number]     Arrival date & time 07/28/14  1226 History  This chart was scribed for [name] GladHeather Keashia Haskins, PA-C, working with Lorre NickAnthony Allen, MD by Leona CarryG. Clay Sherrill, ED Scribe. The patient was seen in TR11C/TR11C. The patient's care was started at 2:11 PM.     Chief Complaint  Patient presents with  . Knee Pain   Patient is a 29 y.o. female presenting with knee pain. The history is provided by the patient. No language interpreter was used.  Knee Pain Associated symptoms: no fever    HPI Comments: [name] [name] is a 29 y.o. female who presents to the Emergency Department complaining of left knee pain beginning 2 weeks ago. Patient reports associated swelling. She first injured the knee when she fell while going up the stairs at her house. She reports that the pain is exacerbated by ROM and ambulation. She has not taken any medication for the pain. Patient denies numbness, fever, or chills.   Past Medical History  Diagnosis Date  . Pregnancy   . Substance abuse   . Normal delivery 02/19/2012  . Anxiety   . Mental disorder   . Depression    Past Surgical History  Procedure Laterality Date  . No past surgeries     Family History  Problem Relation Age of Onset  . Alcohol abuse Neg Hx   . Arthritis Neg Hx   . Asthma Neg Hx   . Birth defects Neg Hx   . Cancer Neg Hx   . Depression Neg Hx   . COPD Neg Hx   . Diabetes Neg Hx   . Early death Neg Hx   . Drug abuse Neg Hx   . Hearing loss Neg Hx   . Heart disease Neg Hx   . Hyperlipidemia Neg Hx   . Hypertension Neg Hx   . Kidney disease Neg Hx   . Learning disabilities Neg Hx   . Mental illness Neg Hx   . Mental retardation Neg Hx   . Miscarriages / Stillbirths Neg Hx   . Stroke Neg Hx   . Vision loss Neg Hx    History  Substance Use Topics  . Smoking status: Current Every Day Smoker -- 2.00 packs/day for 1 years    Types: Cigarettes  . Smokeless tobacco: Not on file  . Alcohol Use: 0.0 oz/week     Comment: 8-40oz   OB  History    Gravida Para Term Preterm AB TAB SAB Ectopic Multiple Living   2 2 0 2 0 0 0 0 0 2      Review of Systems  Constitutional: Negative for fever and chills.  Musculoskeletal: Positive for arthralgias (left knee).  Neurological: Negative for numbness.      Allergies  Review of patient's allergies indicates no known allergies.  Home Medications   Prior to Admission medications   Not on File   Triage Vitals: BP 128/87 mmHg  Pulse 78  Temp(Src) 98.2 F (36.8 C) (Oral)  Resp 16  SpO2 100%  LMP 07/18/2014 Physical Exam  Constitutional: She is oriented to person, place, and time. She appears well-developed and well-nourished. No distress.  HENT:  Head: Normocephalic and atraumatic.  Eyes: Conjunctivae and EOM are normal.  Neck: Neck supple. No tracheal deviation present.  Cardiovascular: Normal rate and regular rhythm.   Pulmonary/Chest: Effort normal and breath sounds normal. No respiratory distress.  Musculoskeletal: Normal range of motion. She exhibits tenderness.  Full ROM of the left knee. No erythema,  warmth, or bruising. Mild pre-patellar swelling. TTP of the medial and lateral aspects of the left knee. No obvious laxity with posterior and anterior drawer. Some pain with valgus stress and varus stress, but no obvious laxity. Distal sensation of the left foot intact. 2+ DP pulse in the left foot.  Neurological: She is alert and oriented to person, place, and time.  Skin: Skin is warm and dry. No erythema.  Psychiatric: She has a normal mood and affect. Her behavior is normal.  Nursing note and vitals reviewed.   ED Course  Procedures (including critical care time) DIAGNOSTIC STUDIES: Oxygen Saturation is 100% on room air, normal by my interpretation.    COORDINATION OF CARE:    Labs Review Labs Reviewed - No data to display  Imaging Review Dg Knee Complete 4 Views Left  07/28/2014   CLINICAL DATA:  Fall. Left knee pain for 2 weeks. Initial encounter.   EXAM: LEFT KNEE - COMPLETE 4+ VIEW  COMPARISON:  04/30/2013  FINDINGS: There is a new, small to moderate size suprapatellar joint effusion. No acute fracture or dislocation is identified. No lytic or blastic osseous lesion or soft tissue abnormality is seen. Joint space widths are preserved.  IMPRESSION: New knee joint effusion.  No acute osseous abnormality identified.   Electronically Signed   By: Sebastian Ache   On: 07/28/2014 14:07     EKG Interpretation None      MDM   Final diagnoses:  None   Patient presents today with left knee pain.  No acute osseous abnormality on xray.  No signs of infection on exam.  No obvious laxity of the ligaments.  Patient given knee sleeve.  RICE instructions given to the patient.  Patient stable for discharge.  Patient given referral to Orthopedics if symptoms continue.  Return precautions given.     [name] Glad, PA-C 07/28/14 1456  Lorre Nick, MD 07/31/14 [phone]

## 2014-07-28 NOTE — ED Notes (Signed)
Pt reports falling two weeks ago and still having pain to left knee. Ambulatory at triage.

## 2014-07-28 NOTE — Discharge Instructions (Signed)
Be sure to read and understand instructions below prior to leaving the hospital. If your symptoms persist without any improvement in 1 week it is recommended that you follow up with orthopedics listed above.  ° °Knee Effusion  °The medical term for having fluid in your knee is effusion.This means something is wrong inside the knee. Some of the causes of fluid in the knee may be torn cartilage, a torn ligament, or bleeding into the joint from an injury. Small tears may heal on their own with conservative treatment. Conservative means rest, limited weight bearing activity and muscle strengthening exercises. Your recovery may take up to 6 weeks. Larger tears may require surgery.  ° °TREATMENT  °Rest, ice, elevation, and compression are the basic modes of treatment.   °Apply ice to the sore area for 15 to 20 minutes, 3 to 4 times per day. Do this while you are awake for the first 2 days, or as directed. This can be stopped when the swelling goes away. Put the ice in a plastic bag and place a towel between the bag of ice and your skin.  °Keep your leg elevated when possible to lessen swelling.  °If your caregiver recommends crutches, use them as instructed for 1 week. Then, you may walk as tolerated.  °Do not drive a vehicle on pain medication. °ACTIVITY: °           - Weight bearing as tolerated °           - Exercises should be limited to pain free range of motion ° °Knee Immobilization:: This is used to support and protect an injured or painful knee. Knee immobilizers keep your knee from being used while it is healing.  °Use powder to control irritation from sweat and friction.  °Adjust the immobilizer to be firm but not tight. Signs of an immobilizer that is too tight include:  ° Swelling.  ° Numbness.  ° Color change in your foot or ankle.  ° Increased pain.  °While resting, raise your leg above the level of your heart. This reduces throbbing and helps healing. Prop it up with pillows.  °Remove the immobilizer to  bathe and sleep. Wear it other times until you see your doctor again.  °             °SEEK MEDICAL CARE IF:  °You have an increase in bruising, swelling, or pain.  °Your toes feel cold.  °Pain relief is not achieved with medications.  °EMERGENCY:: Your toes are numb or blue or you have severe pain.  °You notice redness, swelling, warmth or increasing pain in your knee.  °An unexplained oral temperature above 102° F (38.9° C) develops. ° °COLD THERAPY DIRECTIONS:  °Ice or gel packs can be used to reduce both pain and swelling. Ice is the most helpful within the first 24 to 48 hours after an injury or flareup from overusing a muscle or joint.  Ice is effective, has very few side effects, and is safe for most people to use.  ° °If you expose your skin to cold temperatures for too long or without the proper protection, you can damage your skin or nerves. Watch for signs of skin damage due to cold.  ° °HOME CARE INSTRUCTIONS  °Follow these tips to use ice and cold packs safely.  °Place a dry or damp towel between the ice and skin. A damp towel will cool the skin more quickly, so you may need to shorten the time that   the ice is used.  °For a more rapid response, add gentle compression to the ice.  °Ice for no more than 10 to 20 minutes at a time. The bonier the area you are icing, the less time it will take to get the benefits of ice.  °Check your skin after 5 minutes to make sure there are no signs of a poor response to cold or skin damage.  °Rest 20 minutes or more in between uses.  °Once your skin is numb, you can end your treatment. You can test numbness by very lightly touching your skin. The touch should be so light that you do not see the skin dimple from the pressure of your fingertip. When using ice, most people will feel these normal sensations in this order: cold, burning, aching, and numbness.  °Do not use ice on someone who cannot communicate their responses to pain, such as small children or people with  dementia.  ° °HOW TO MAKE AN ICE PACK  °To make an ice pack, do one of the following:  °Place crushed ice or a bag of frozen vegetables in a sealable plastic bag. Squeeze out the excess air. Place this bag inside another plastic bag. Slide the bag into a pillowcase or place a damp towel between your skin and the bag.  °Mix 3 parts water with 1 part rubbing alcohol. Freeze the mixture in a sealable plastic bag. When you remove the mixture from the freezer, it will be slushy. Squeeze out the excess air. Place this bag inside another plastic bag. Slide the bag into a pillowcase or place a damp towel between your s ° ° ° ° ° °

## 2014-10-24 ENCOUNTER — Emergency Department (HOSPITAL_COMMUNITY)
Admission: EM | Admit: 2014-10-24 | Discharge: 2014-10-24 | Disposition: A | Discharge status: Home / Self Care | Payer: Medicaid Other | Attending: Emergency Medicine | Admitting: Emergency Medicine

## 2014-10-24 DIAGNOSIS — F10120 Alcohol abuse with intoxication, uncomplicated: Secondary | ICD-10-CM | POA: Insufficient documentation

## 2014-10-24 DIAGNOSIS — F1410 Cocaine abuse, uncomplicated: Secondary | ICD-10-CM | POA: Insufficient documentation

## 2014-10-24 DIAGNOSIS — Z720 Tobacco use: Secondary | ICD-10-CM | POA: Insufficient documentation

## 2014-10-24 DIAGNOSIS — F10920 Alcohol use, unspecified with intoxication, uncomplicated: Secondary | ICD-10-CM

## 2014-10-24 DIAGNOSIS — Z3202 Encounter for pregnancy test, result negative: Secondary | ICD-10-CM | POA: Insufficient documentation

## 2014-10-24 DIAGNOSIS — Z791 Long term (current) use of non-steroidal anti-inflammatories (NSAID): Secondary | ICD-10-CM | POA: Insufficient documentation

## 2014-10-24 LAB — COMPREHENSIVE METABOLIC PANEL
ALT: 13 U/L — ABNORMAL LOW (ref 14–54)
ANION GAP: 10 (ref 5–15)
AST: 17 U/L (ref 15–41)
Albumin: 3.8 g/dL (ref 3.5–5.0)
Alkaline Phosphatase: 91 U/L (ref 38–126)
BUN: 9 mg/dL (ref 6–20)
CHLORIDE: 112 mmol/L — AB (ref 101–111)
CO2: 22 mmol/L (ref 22–32)
Calcium: 8.5 mg/dL — ABNORMAL LOW (ref 8.9–10.3)
Creatinine, Ser: 1.06 mg/dL — ABNORMAL HIGH (ref 0.44–1.00)
GFR calc Af Amer: >60 mL/min (ref >60)
Glucose, Bld: 115 mg/dL — ABNORMAL HIGH (ref 65–99)
Potassium: 3.2 mmol/L — ABNORMAL LOW (ref 3.5–5.1)
Sodium: 144 mmol/L (ref 135–145)
Total Bilirubin: 0.4 mg/dL (ref 0.3–1.2)
Total Protein: 6.6 g/dL (ref 6.5–8.1)

## 2014-10-24 LAB — URINALYSIS, ROUTINE W REFLEX MICROSCOPIC
Bilirubin Urine: NEGATIVE
GLUCOSE, UA: NEGATIVE mg/dL
Hgb urine dipstick: NEGATIVE
KETONES UR: NEGATIVE mg/dL
LEUKOCYTES UA: NEGATIVE
NITRITE: NEGATIVE
PH: 5.5 (ref 5.0–8.0)
Protein, ur: NEGATIVE mg/dL
Specific Gravity, Urine: 1.028 (ref 1.005–1.030)
Urobilinogen, UA: 1 mg/dL (ref 0.0–1.0)

## 2014-10-24 LAB — CBC WITH DIFFERENTIAL/PLATELET
BASOS PCT: 0 % (ref 0–1)
Basophils Absolute: 0 10*3/uL (ref 0.0–0.1)
Eosinophils Absolute: 0.1 10*3/uL (ref 0.0–0.7)
Eosinophils Relative: 1 % (ref 0–5)
HEMATOCRIT: 39.2 % (ref 36.0–46.0)
HEMOGLOBIN: 13.6 g/dL (ref 12.0–15.0)
LYMPHS PCT: 33 % (ref 12–46)
Lymphs Abs: 4 10*3/uL (ref 0.7–4.0)
MCH: 29.2 pg (ref 26.0–34.0)
MCHC: 34.7 g/dL (ref 30.0–36.0)
MCV: 84.1 fL (ref 78.0–100.0)
MONO ABS: 0.8 10*3/uL (ref 0.1–1.0)
Monocytes Relative: 6 % (ref 3–12)
NEUTROS ABS: 7.1 10*3/uL (ref 1.7–7.7)
NEUTROS PCT: 60 % (ref 43–77)
Platelets: 278 10*3/uL (ref 150–400)
RBC: 4.66 MIL/uL (ref 3.87–5.11)
RDW: 14.5 % (ref 11.5–15.5)
WBC: 12.1 10*3/uL — ABNORMAL HIGH (ref 4.0–10.5)

## 2014-10-24 LAB — RAPID URINE DRUG SCREEN, HOSP PERFORMED
Amphetamines: NOT DETECTED
BENZODIAZEPINES: NOT DETECTED
Barbiturates: NOT DETECTED
Cocaine: POSITIVE — AB
Opiates: NOT DETECTED
Tetrahydrocannabinol: NOT DETECTED

## 2014-10-24 LAB — ETHANOL: ALCOHOL ETHYL (B): 137 mg/dL — AB (ref <5)

## 2014-10-24 LAB — PREGNANCY, URINE: Preg Test, Ur: NEGATIVE

## 2014-10-24 NOTE — Discharge Instructions (Signed)
Alcohol Intoxication °Alcohol intoxication occurs when the amount of alcohol that a person has consumed impairs his or her ability to mentally and physically function. Alcohol directly impairs the normal chemical activity of the brain. Drinking large amounts of alcohol can lead to changes in mental function and behavior, and it can cause many physical effects that can be harmful.  °Alcohol intoxication can range in severity from mild to very severe. Various factors can affect the level of intoxication that occurs, such as the person's age, gender, weight, frequency of alcohol consumption, and the presence of other medical conditions (such as diabetes, seizures, or heart conditions). Dangerous levels of alcohol intoxication may occur when people drink large amounts of alcohol in a short period (binge drinking). Alcohol can also be especially dangerous when combined with certain prescription medicines or "recreational" drugs. °SIGNS AND SYMPTOMS °Some common signs and symptoms of mild alcohol intoxication include: °· Loss of coordination. °· Changes in mood and behavior. °· Impaired judgment. °· Slurred speech. °As alcohol intoxication progresses to more severe levels, other signs and symptoms will appear. These may include: °· Vomiting. °· Confusion and impaired memory. °· Slowed breathing. °· Seizures. °· Loss of consciousness. °DIAGNOSIS  °Your health care provider will take a medical history and perform a physical exam. You will be asked about the amount and type of alcohol you have consumed. Blood tests will be done to measure the concentration of alcohol in your blood. In many places, your blood alcohol level must be lower than 80 mg/dL (0.08%) to legally drive. However, many dangerous effects of alcohol can occur at much lower levels.  °TREATMENT  °People with alcohol intoxication often do not require treatment. Most of the effects of alcohol intoxication are temporary, and they go away as the alcohol naturally  leaves the body. Your health care provider will monitor your condition until you are stable enough to go home. Fluids are sometimes given through an IV access tube to help prevent dehydration.  °HOME CARE INSTRUCTIONS °· Do not drive after drinking alcohol. °· Stay hydrated. Drink enough water and fluids to keep your urine clear or pale yellow. Avoid caffeine.   °· Only take over-the-counter or prescription medicines as directed by your health care provider.   °SEEK MEDICAL CARE IF:  °· You have persistent vomiting.   °· You do not feel better after a few days. °· You have frequent alcohol intoxication. Your health care provider can help determine if you should see a substance use treatment counselor. °SEEK IMMEDIATE MEDICAL CARE IF:  °· You become shaky or tremble when you try to stop drinking.   °· You shake uncontrollably (seizure).   °· You throw up (vomit) blood. This may be bright red or may look like black coffee grounds.   °· You have blood in your stool. This may be bright red or may appear as a black, tarry, bad smelling stool.   °· You become lightheaded or faint.   °MAKE SURE YOU:  °· Understand these instructions. °· Will watch your condition. °· Will get help right away if you are not doing well or get worse. °Document Released: 11/29/2004 Document Revised: 10/22/2012 Document Reviewed: 07/25/2012 °ExitCare® Patient Information ©2015 [employer]. This information is not intended to replace advice given to you by your health care provider. Make sure you discuss any questions you have with your health care provider. ° ° °Emergency Department Resource Guide °1) Find a Doctor and Pay Out of Pocket °Although you won't have to find out who is covered by your   insurance plan, it is a good idea to ask around and get recommendations. You will then need to call the office and see if the doctor you have chosen will accept you as a new patient and what types of options they offer for patients who are self-pay.  Some doctors offer discounts or will set up payment plans for their patients who do not have insurance, but you will need to ask so you aren't surprised when you get to your appointment. ° °2) Contact Your Local Health Department °Not all health departments have doctors that can see patients for sick visits, but many do, so it is worth a call to see if yours does. If you don't know where your local health department is, you can check in your phone book. The CDC also has a tool to help you locate your state's health department, and many state websites also have listings of all of their local health departments. ° °3) Find a Walk-in Clinic °If your illness is not likely to be very severe or complicated, you may want to try a walk in clinic. These are popping up all over the country in pharmacies, drugstores, and shopping centers. They're usually staffed by nurse practitioners or physician assistants that have been trained to treat common illnesses and complaints. They're usually fairly quick and inexpensive. However, if you have serious medical issues or chronic medical problems, these are probably not your best option. ° °No Primary Care Doctor: °- Call [facility] at  832-8000 - they can help you locate a primary care doctor that  accepts your insurance, provides certain services, etc. °- Physician Referral Service- 1-800-533-3463 ° °Chronic Pain Problems: °Organization         Address  Phone   Notes  °[facility]  (336) 297-2271 Patients need to be referred by their primary care doctor.  ° °Medication Assistance: °Organization         Address  Phone   Notes  °[place] [facility] [address] °[place], [place] [address] (336) 641-8030 --Must be a resident of [place] °-- Must have NO insurance coverage whatsoever (no Medicaid/ Medicare, etc.) °-- The pt. MUST have a primary care doctor that directs their care regularly and follows them in the  community °  °[facility]  (866) 331-1348   °[employer]  (888) 892-1162   ° °Agencies that provide inexpensive medical care: °Organization         Address  Phone   Notes  °[facility]  (336) 832-8035   °[facility]    (336) 832-7272   °[facility] [address] °[place], [place] [address] (336) 832-4777   °[facility] [address], °[place] (336) 271-4999   °[facility]    (336) 373-0678   °[facility]    (336) 272-1050   °[facility] ° [address], [place] Phone:  (336) 832-4444, Fax:  (336) 832-4440 Hours of Operation:  9 am - 6 pm, M-F.  Also accepts Medicaid/Medicare and self-pay.  °[facility] ° [address], [place] Phone: (336) 832-3150, Fax: (336) 832-3151. Hours of Operation:  8:30 am - 5:30 pm, M-F.  Also accepts Medicaid and self-pay.  °[facility] [place] [address], [place] Phone: (336) 878-6027   °[facility] [address], [place], [place] (336)723-1848, Ext. 123 Mondays &   Thursdays: 7-9 AM.  First 15 patients are seen on a first come, first serve basis. °  ° °Medicaid-accepting [place] Providers: ° °Organization         Address  Phone   Notes  °[facility] [address], [place] (336) 641-2100 Also accepts self-pay patients.  °[facility] [address], [place] ° (336) 856-9996   °[facility] [address], [place] (336) 288-8857   °[facility] [address] [place] [address], [place] (336) 299-7000   °Veita Bland [address], [place]  ° (336) 373-1557 Only accepts [place] Access Medicaid patients after they have their name applied to their card.  ° °Self-Pay (no insurance) in [place]: ° °Organization         Address  Phone   Notes  °[facility] [address], [place] (336) 832-1970   °[facility] [address], [place] (336) 832-4400   °[facility] ° [address], [place] (336) 992-4800   °[facility]/Dr. Osei-Bonsu ° [address] [place] [address], [place] or [address], [place] (336) [phone] Phone number for both [place] and [place] locations is the same.  °[facility] [address], [place] (336) 299-0000   °[facility] [address] [place] [address], [place] or [address] (336) 852-7530 °(336) 878-2260   °[facility] [address], [place] (336) 350-1642, phone; (336) 294-5005, fax Sees patients 1st and 3rd Saturday of every month.  Must not qualify for public or private insurance (i.e. Medicaid, Medicare, [place] Health Choice, Veterans' Benefits) • Household income should be no more than 200% of the poverty level •The clinic cannot treat you if you are pregnant or think you are pregnant • Sexually transmitted diseases are not treated at the clinic.  ° ° °Dental Care: °Organization         Address  Phone  Notes  °[facility] [facility] [address], [place] (336) 641-6152 Accepts children up to age 21 who are enrolled in Medicaid or [place] Health Choice; pregnant women with a Medicaid card; and children who have applied for Medicaid or [place] Health Choice, but were declined, whose parents can pay a reduced fee at time of service.  °[facility] [place]  [address], [place] (336) 641-7733 Accepts children up to age 21 who are enrolled in Medicaid or [place] Health Choice; pregnant women with a Medicaid card; and children who have applied for Medicaid or [place] Health Choice, but were declined, whose parents can pay a reduced fee at time of service.  °[facility] ° [address],  (336) 641-4533 Patients are seen by appointment only. Walk-ins are not accepted. [facility] will see patients 18 years of age and older. °Monday - Tuesday (8am-5pm) °Most Wednesdays (8:30-5pm) °$30 per visit, cash only  °[facility] ° [address], [place] (336) 641-4533 Patients are seen by appointment only. Walk-ins are not accepted. [facility] will see patients 18 years of age and older. °One Wednesday Evening (Monthly: Volunteer Based).  $30 per visit, cash only  °[facility]  (919) 537-3737 for adults; Children under   age 4, call Graduate Pediatric Dentistry at (919) [phone]. Children aged 4-14, please call (919) [phone] to request a pediatric application. ° Dental services are provided in all areas of dental care including fillings, crowns and bridges, complete and partial dentures, implants, gum treatment, root canals, and extractions. Preventive care is also provided. Treatment is provided to both adults and children. °Patients are selected via a lottery and there is often a waiting list. °  °[facility] [address], °[place] ° (336) 763-8833 [email or web address] °  °[facility] [address], [place], [place] (336)723-1848, Ext. 123 Second and Fourth Thursday of each month, opens at 6:30 AM; Clinic ends at 9 AM.  Patients are seen on a first-come first-served basis, and a limited number are seen during each clinic.  ° °[facility] ° [address], [place], [place] (336) 723-7904   Eligibility Requirements °You must have lived in [place], [place], or [place] counties for at least the last three months. °  You cannot be eligible for state or federal sponsored healthcare insurance, including Veterans Administration, Medicaid, or Medicare. °  You generally cannot be eligible for healthcare insurance through your employer.  °  How to apply: °Eligibility screenings are held every  Tuesday and Wednesday afternoon from 1:00 pm until 4:00 pm. You do not need an appointment for the interview!  °[facility] [address], [place], [place] 336-631-2330   °[facility]  336-342-8273   °[place]  336-703-3100   °[facility]  336-570-6415   ° °Behavioral Health Resources in the Community: °Intensive Outpatient Programs °Organization         Address  Phone  Notes  °[facility] [address], [place], [place] 336-878-6098   °[facility] [address], [place], [place] 336-832-9800   °[facility] [address], [place], [place] ° 336-882-2125   °[facility] [address],  °[place], [place] 1-800-853-5163 or 336-641-4981   °Substance Abuse Resources °Organization         Address  Phone  Notes  °[facility]  336-882-2125   °[facility]  336-784-9470   °[facility]  336-285-9073   °[facility]  336-845-3988   °Residential & Outpatient Substance Abuse Program  1-800-659-3381   °Psychological Services °Organization         Address  Phone  Notes  ° Health  336- 832-9600   °[facility]  336- 378-7881   °[facility] [address], [place] 1-800-853-5163 or 336-641-4981   ° °Mobile Crisis Teams °Organization         Address  Phone  Notes  °[facility], Mobile Crisis Care Unit  1-877-626-1772   °Assertive °[facility] ° [address] [place], [place] 336-834-9664   °Sharon DeEsch [address] °[place] 336-554-5454   ° °Self-Help/Support Groups °Organization         Address  Phone             Notes  °[facility] - variety of support groups  336- 373-1402 Call for more information  °Narcotics Anonymous (NA), [facility] [address], °[place]  2 meetings at this location   ° °Residential Treatment Programs °Organization         Address  Phone  Notes  °[facility] [address],    °[place]  1-866-801-8205   °[facility] ° [address], [place], [place] 704-293-8524   °[facility] [address], [place] 336-845-3988 Admissions: 8am-3pm M-F  °[facility] [address]    °[place], [place] 336-841-1104   °[facility] [address], [place], [place] 336-379-7146   °[facility] [address]  °[place], [place] 336-285-9073   °[facility] Intensive Outpatient [address], [place], [place] 336-852-3033   °[facility] [address]  °[place], [place] 1-877-615-2722 or 336-784-9470   °[facility] [address] [place], [place] 336-227-7417 Accepts Medicaid  °[facility] [address]  °[place] 1-800-659-3381 Substance Abuse/Addiction Treatment  ° °[place] Behavioral Health Resources °Organization         Address  Phone  Notes  °[facility]  (888) 581-9988   °Julie Brannon, PhD [address] [place], [place]   (336) 349-5553 or (336) 951-0000   °[facility]   [address] °[place], [place] (336) 349-4454   °[facility] [address], [place], [place] (336) 342-8316 Insurance/Medicaid/sponsorship through [facility]  °[facility] [address]                                    [place], [place] (336) 342-8316 Therapy/tele-psych/case  °[facility] [address]  ° [place],  (336) 349-2233    °Dr. Arfeen  (336) 349-4544   °[facility] [place]  [employer] [place] [facility] 1) [address], [place] °2) [address], [place] °3)  [address], [place] (336) 349-3220 °(336) 342-7768 ° °(336) 342-8140   °[place] [facility] (336) 342-1394 or (336) 342-3537 (After  Hours)    ° ° ° °

## 2014-10-24 NOTE — ED Notes (Signed)
Pt sleeping. Will continue to monitor
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
## 2014-10-24 NOTE — ED Notes (Addendum)
PT aware of need for urine sample. When asked patient will not answer this RN however, patient follows commands (asked patient to raise arm in order to apply BP cuff and patient did so). Will continue to monitor.

## 2014-10-24 NOTE — ED Notes (Signed)
Pt reports that she needs help with alcohol and has no on to pick her up. Dr Littie Deeds made aware and is at pt bedside
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
## 2014-10-24 NOTE — ED Notes (Signed)
Pt opens eyes briefly to stimuli then returns to sleeping. RR even and unlabored, ukn substance usage.

## 2014-10-24 NOTE — ED Notes (Signed)
Pt ambulated to BR and back to room w/o assistance. Pt was provided with sandwich, juice and sprite

## 2014-12-24 ENCOUNTER — Emergency Department (HOSPITAL_COMMUNITY)
Admission: EM | Admit: 2014-12-24 | Discharge: 2014-12-24 | Disposition: A | Discharge status: Home / Self Care | Payer: Medicaid Other | Attending: Emergency Medicine | Admitting: Emergency Medicine

## 2014-12-24 ENCOUNTER — Encounter (HOSPITAL_COMMUNITY): Payer: Self-pay | Admitting: Cardiology

## 2014-12-24 DIAGNOSIS — O99331 Smoking (tobacco) complicating pregnancy, first trimester: Secondary | ICD-10-CM | POA: Insufficient documentation

## 2014-12-24 DIAGNOSIS — L0291 Cutaneous abscess, unspecified: Secondary | ICD-10-CM

## 2014-12-24 DIAGNOSIS — O99711 Diseases of the skin and subcutaneous tissue complicating pregnancy, first trimester: Secondary | ICD-10-CM | POA: Diagnosis not present

## 2014-12-24 DIAGNOSIS — F17210 Nicotine dependence, cigarettes, uncomplicated: Secondary | ICD-10-CM | POA: Insufficient documentation

## 2014-12-24 DIAGNOSIS — L02211 Cutaneous abscess of abdominal wall: Secondary | ICD-10-CM | POA: Insufficient documentation

## 2014-12-24 DIAGNOSIS — Z8659 Personal history of other mental and behavioral disorders: Secondary | ICD-10-CM | POA: Insufficient documentation

## 2014-12-24 DIAGNOSIS — Z3A10 10 weeks gestation of pregnancy: Secondary | ICD-10-CM | POA: Insufficient documentation

## 2014-12-24 DIAGNOSIS — O99611 Diseases of the digestive system complicating pregnancy, first trimester: Secondary | ICD-10-CM | POA: Diagnosis not present

## 2014-12-24 DIAGNOSIS — K5900 Constipation, unspecified: Secondary | ICD-10-CM | POA: Insufficient documentation

## 2014-12-24 MED ORDER — DOCUSATE SODIUM 100 MG PO CAPS: 100.0000 mg | ORAL_CAPSULE | Freq: Two times a day (BID) | ORAL | Status: AC

## 2014-12-24 MED ORDER — LIDOCAINE HCL (PF) 1 % IJ SOLN
5.0000 mL | Freq: Once | INTRAMUSCULAR | Status: DC
  Filled 2014-12-24: qty 5

## 2014-12-24 NOTE — ED Notes (Signed)
Pt reports she has an abscess to right upper inner thigh near groin area. She states pus has been draining from it. She also is requesting to be tested for STDs.

## 2014-12-24 NOTE — ED Notes (Signed)
States she is [redacted] weeks pregnant and has not had a bowel movement in 3 days. No other complaints.

## 2014-12-24 NOTE — Discharge Instructions (Signed)
Drink plenty of fluids and eat leafy vegetables and fruits. Increase fiber in your diet, or take fiber supplements. Take the stool softener. See your primary doctor or OB for concerns on STD, especially given you are pregnant.   Abscess An abscess is an infected area that contains a collection of pus and debris.It can occur in almost any part of the body. An abscess is also known as a furuncle or boil. CAUSES  An abscess occurs when tissue gets infected. This can occur from blockage of oil or sweat glands, infection of hair follicles, or a minor injury to the skin. As the body tries to fight the infection, pus collects in the area and creates pressure under the skin. This pressure causes pain. People with weakened immune systems have difficulty fighting infections and get certain abscesses more often.  SYMPTOMS Usually an abscess develops on the skin and becomes a painful mass that is red, warm, and tender. If the abscess forms under the skin, you may feel a moveable soft area under the skin. Some abscesses break open (rupture) on their own, but most will continue to get worse without care. The infection can spread deeper into the body and eventually into the bloodstream, causing you to feel ill.  DIAGNOSIS  Your caregiver will take your medical history and perform a physical exam. A sample of fluid may also be taken from the abscess to determine what is causing your infection. TREATMENT  Your caregiver may prescribe antibiotic medicines to fight the infection. However, taking antibiotics alone usually does not cure an abscess. Your caregiver may need to make a small cut (incision) in the abscess to drain the pus. In some cases, gauze is packed into the abscess to reduce pain and to continue draining the area. HOME CARE INSTRUCTIONS   Only take over-the-counter or prescription medicines for pain, discomfort, or fever as directed by your caregiver.  If you were prescribed antibiotics, take them  as directed. Finish them even if you start to feel better.  If gauze is used, follow your caregiver's directions for changing the gauze.  To avoid spreading the infection:  Keep your draining abscess covered with a bandage.  Wash your hands well.  Do not share personal care items, towels, or whirlpools with others.  Avoid skin contact with others.  Keep your skin and clothes clean around the abscess.  Keep all follow-up appointments as directed by your caregiver. SEEK MEDICAL CARE IF:   You have increased pain, swelling, redness, fluid drainage, or bleeding.  You have muscle aches, chills, or a general ill feeling.  You have a fever. MAKE SURE YOU:   Understand these instructions.  Will watch your condition.  Will get help right away if you are not doing well or get worse.   This information is not intended to replace advice given to you by your health care provider. Make sure you discuss any questions you have with your health care provider.   Document Released: 11/29/2004 Document Revised: 08/21/2011 Document Reviewed: 05/04/2011 Elsevier Interactive Patient Education 2016 [employer]  Constipation, Adult Constipation is when a person has fewer than three bowel movements a week, has difficulty having a bowel movement, or has stools that are dry, hard, or larger than normal. As people grow older, constipation is more common. A low-fiber diet, not taking in enough fluids, and taking certain medicines may make constipation worse.  CAUSES   Certain medicines, such as antidepressants, pain medicine, iron supplements, antacids, and water pills.  Certain diseases, such as diabetes, irritable bowel syndrome (IBS), thyroid disease, or depression.   Not drinking enough water.   Not eating enough fiber-rich foods.   Stress or travel.   Lack of physical activity or exercise.   Ignoring the urge to have a bowel movement.   Using laxatives too much.  SIGNS AND  SYMPTOMS   Having fewer than three bowel movements a week.   Straining to have a bowel movement.   Having stools that are hard, dry, or larger than normal.   Feeling full or bloated.   Pain in the lower abdomen.   Not feeling relief after having a bowel movement.  DIAGNOSIS  Your health care provider will take a medical history and perform a physical exam. Further testing may be done for severe constipation. Some tests may include:  A barium enema X-ray to examine your rectum, colon, and, sometimes, your small intestine.   A sigmoidoscopy to examine your lower colon.   A colonoscopy to examine your entire colon. TREATMENT  Treatment will depend on the severity of your constipation and what is causing it. Some dietary treatments include drinking more fluids and eating more fiber-rich foods. Lifestyle treatments may include regular exercise. If these diet and lifestyle recommendations do not help, your health care provider may recommend taking over-the-counter laxative medicines to help you have bowel movements. Prescription medicines may be prescribed if over-the-counter medicines do not work.  HOME CARE INSTRUCTIONS   Eat foods that have a lot of fiber, such as fruits, vegetables, whole grains, and beans.  Limit foods high in fat and processed sugars, such as french fries, hamburgers, cookies, candies, and soda.   A fiber supplement may be added to your diet if you cannot get enough fiber from foods.   Drink enough fluids to keep your urine clear or pale yellow.   Exercise regularly or as directed by your health care provider.   Go to the restroom when you have the urge to go. Do not hold it.   Only take over-the-counter or prescription medicines as directed by your health care provider. Do not take other medicines for constipation without talking to your health care provider first.  SEEK IMMEDIATE MEDICAL CARE IF:   You have bright red blood in your stool.    Your constipation lasts for more than 4 days or gets worse.   You have abdominal or rectal pain.   You have thin, pencil-like stools.   You have unexplained weight loss. MAKE SURE YOU:   Understand these instructions.  Will watch your condition.  Will get help right away if you are not doing well or get worse.   This information is not intended to replace advice given to you by your health care provider. Make sure you discuss any questions you have with your health care provider.   Document Released: 11/18/2003 Document Revised: 03/12/2014 Document Reviewed: 12/01/2012 Elsevier Interactive Patient Education [employer]

## 2014-12-24 NOTE — ED Provider Notes (Signed)
CSN: [id number]     Arrival date & time 12/24/14  1418 History   First MD Initiated Contact with Patient 12/24/14 1543     Chief Complaint  Patient presents with  . Constipation     (Consider location/radiation/quality/duration/timing/severity/associated sxs/prior Treatment) HPI Comments: Pt comes in with 3 complains. Main complain is constipation x 3 days. PT hasnt had a BM. She is passing flatus. She has an appetite. No hx of  abd surgeries. Pt also c/o abscess in her R groin. She reports noticing mild drainage, yellow in color. She does shave the area. The lesion was noted earlier this week. Mild pain.  Finally - with the rash mentioned above, pt is concerned that the etiology is STDs. She has no vaginal discharge. No abd pain. She has an OB appointment next week.   ROS 10 Systems reviewed and are negative for acute change except as noted in the HPI.     Patient is a 29 y.o. female presenting with constipation. The history is provided by the patient.  Constipation   Past Medical History  Diagnosis Date  . Pregnancy   . Substance abuse   . Normal delivery 02/19/2012  . Anxiety   . Mental disorder   . Depression    Past Surgical History  Procedure Laterality Date  . No past surgeries     Family History  Problem Relation Age of Onset  . Alcohol abuse Neg Hx   . Arthritis Neg Hx   . Asthma Neg Hx   . Birth defects Neg Hx   . Cancer Neg Hx   . Depression Neg Hx   . COPD Neg Hx   . Diabetes Neg Hx   . Early death Neg Hx   . Drug abuse Neg Hx   . Hearing loss Neg Hx   . Heart disease Neg Hx   . Hyperlipidemia Neg Hx   . Hypertension Neg Hx   . Kidney disease Neg Hx   . Learning disabilities Neg Hx   . Mental illness Neg Hx   . Mental retardation Neg Hx   . Miscarriages / Stillbirths Neg Hx   . Stroke Neg Hx   . Vision loss Neg Hx    Social History  Substance Use Topics  . Smoking status: Current Every Day Smoker -- 2.00 packs/day for 1 years    Types:  Cigarettes  . Smokeless tobacco: None  . Alcohol Use: 0.0 oz/week     Comment: 8-40oz   OB History    Gravida Para Term Preterm AB TAB SAB Ectopic Multiple Living       Review of Systems  Gastrointestinal: Positive for constipation.  All other systems reviewed and are negative.     Allergies  Review of patient's allergies indicates no known allergies.  Home Medications   Prior to Admission medications   Medication Sig Start Date End Date Taking? Authorizing Provider  docusate sodium (COLACE) 100 MG capsule Take 1 capsule (100 mg total) by mouth every 12 (twelve) hours. 12/24/14   Derwood Kaplan, MD  naproxen (NAPROSYN) 500 MG tablet Take 1 tablet (500 mg total) by mouth 2 (two) times daily. Patient not taking: Reported on 10/24/2014 07/28/14   Santiago Glad, PA-C   BP 125/78 mmHg  Pulse 86  Temp(Src) 98 F (36.7 C) (Oral)  Resp 16  Ht  (1.727 m)  Wt 237 lb (107.502 kg)  BMI 36.04 kg/m2  SpO2 98%  LMP 10/08/2014 Physical Exam  Constitutional: She is oriented to person, place, and time. She appears well-developed.  HENT:  Head: Normocephalic and atraumatic.  Eyes: EOM are normal.  Neck: Normal range of motion. Neck supple.  Cardiovascular: Normal rate.   Pulmonary/Chest: Effort normal.  Abdominal: Bowel sounds are normal. There is no tenderness.  Musculoskeletal:  Right groin pain - there is a 1 cm lesion, with induration and a pustule on top. No surrounding erythema  Neurological: She is alert and oriented to person, place, and time.  Skin: Skin is warm and dry.  Nursing note and vitals reviewed.   ED Course  Procedures (including critical care time)  INCISION AND DRAINAGE Performed by: Derwood KaplanNanavati, Manasvi Dickard Consent: Verbal consent obtained. Risks and benefits: risks, benefits and alternatives were discussed Type: abscess  Body area: R groin  Anesthesia: local infiltration  Incision was made with a scalpel.  Local anesthetic:  lidocaine 1 % with epinephrine  Anesthetic total: 2 ml  Complexity: simple Blunt dissection to break up loculations  Drainage: purulent  Drainage amount: scant   Patient tolerance: Patient tolerated the procedure well with no immediate complications.     Labs Review Labs Reviewed - No data to display  Imaging Review No results found. I have personally reviewed and evaluated these images and lab results as part of my medical decision-making.   EKG Interpretation None      MDM   Final diagnoses:  Constipation during pregnancy in first trimester  Abscess    Pt comes in with cc of constipation, abscess and STD evaluate. Pt is pregnant. She has no signs of obstruction and is she is passing flatus. Advised more fiber in the diet. Colace given. Abscess- tiny pustule, likely ingrown hair. Drained. No antibiotics needed. STD - no discharge right now. She was concerned the abscesses site is due to STD, but that lesion is likely an ingrown hair. She has an OB appointment next week - it will be best for her to discuss her concerns and get full workup at that time, rather than our incomplete ER workup.   Derwood KaplanAnkit Sereniti Wan, MD 12/24/14 1758

## 2014-12-24 NOTE — ED Notes (Signed)
Pt A&Ox4, ambulatory at d/c with steady gait, NAD 

## 2015-01-03 ENCOUNTER — Inpatient Hospital Stay (HOSPITAL_COMMUNITY): Payer: Medicaid Other

## 2015-01-03 ENCOUNTER — Encounter (HOSPITAL_COMMUNITY): Payer: Self-pay | Admitting: *Deleted

## 2015-01-03 ENCOUNTER — Inpatient Hospital Stay (HOSPITAL_COMMUNITY)
Admission: AD | Admit: 2015-01-03 | Discharge: 2015-01-03 | Disposition: A | Discharge status: Home / Self Care | Payer: Medicaid Other | Source: Ambulatory Visit | Attending: Family Medicine | Admitting: Family Medicine

## 2015-01-03 DIAGNOSIS — O26899 Other specified pregnancy related conditions, unspecified trimester: Secondary | ICD-10-CM

## 2015-01-03 DIAGNOSIS — Z3A12 12 weeks gestation of pregnancy: Secondary | ICD-10-CM | POA: Insufficient documentation

## 2015-01-03 DIAGNOSIS — O26891 Other specified pregnancy related conditions, first trimester: Secondary | ICD-10-CM | POA: Diagnosis not present

## 2015-01-03 DIAGNOSIS — O9989 Other specified diseases and conditions complicating pregnancy, childbirth and the puerperium: Secondary | ICD-10-CM

## 2015-01-03 DIAGNOSIS — F17210 Nicotine dependence, cigarettes, uncomplicated: Secondary | ICD-10-CM | POA: Insufficient documentation

## 2015-01-03 DIAGNOSIS — W57XXXA Bitten or stung by nonvenomous insect and other nonvenomous arthropods, initial encounter: Secondary | ICD-10-CM | POA: Insufficient documentation

## 2015-01-03 DIAGNOSIS — O99331 Smoking (tobacco) complicating pregnancy, first trimester: Secondary | ICD-10-CM | POA: Diagnosis not present

## 2015-01-03 DIAGNOSIS — R109 Unspecified abdominal pain: Secondary | ICD-10-CM | POA: Diagnosis not present

## 2015-01-03 LAB — CBC
HCT: 37.6 % (ref 36.0–46.0)
HEMOGLOBIN: 13.5 g/dL (ref 12.0–15.0)
MCH: 29.5 pg (ref 26.0–34.0)
MCHC: 35.9 g/dL (ref 30.0–36.0)
MCV: 82.1 fL (ref 78.0–100.0)
PLATELETS: 346 10*3/uL (ref 150–400)
RBC: 4.58 MIL/uL (ref 3.87–5.11)
RDW: 12.7 % (ref 11.5–15.5)
WBC: 13.6 10*3/uL — ABNORMAL HIGH (ref 4.0–10.5)

## 2015-01-03 LAB — HCG, QUANTITATIVE, PREGNANCY: HCG, BETA CHAIN, QUANT, S: 26436 m[IU]/mL — AB (ref <5)

## 2015-01-03 LAB — RAPID URINE DRUG SCREEN, HOSP PERFORMED
Amphetamines: NOT DETECTED
Barbiturates: NOT DETECTED
Benzodiazepines: NOT DETECTED
Cocaine: NOT DETECTED
Opiates: NOT DETECTED
Tetrahydrocannabinol: NOT DETECTED

## 2015-01-03 LAB — WET PREP, GENITAL
Clue Cells Wet Prep HPF POC: NONE SEEN
TRICH WET PREP: NONE SEEN
WBC WET PREP: NONE SEEN
YEAST WET PREP: NONE SEEN

## 2015-01-03 MED ORDER — CEPHALEXIN 500 MG PO CAPS: 500.0000 mg | ORAL_CAPSULE | Freq: Four times a day (QID) | ORAL | Status: AC

## 2015-01-03 MED ORDER — TRIAMCINOLONE ACETONIDE 0.5 % EX CREA: 1.0000 "application " | TOPICAL_CREAM | Freq: Three times a day (TID) | CUTANEOUS | Status: AC

## 2015-01-03 NOTE — MAU Note (Signed)
Urine in lab 

## 2015-01-03 NOTE — MAU Note (Signed)
Pt thinks she has spider bite on her R calf, also C/O exzema on both arms which is itching.  Pt states she is ? [redacted] weeks pregnant, has appt @ [facility] for next week.  Lower abd pain for the past week, denies bleeding or discharge.

## 2015-01-03 NOTE — MAU Note (Signed)
Pt called, not in lobby 

## 2015-01-03 NOTE — MAU Provider Note (Signed)
History     CSN: [id number]  Arrival date and time: 01/03/15 1057   First Provider Initiated Contact with Patient 01/03/15 1200      Chief Complaint  Patient presents with  . Insect Bite  . Abdominal Pain   HPI [name] [name] 29 y.o. [id number] [redacted]w[redacted]d presents to the MAU with c/o abdominal pain, excema and a possible spider bite on her right calf. No vaginal bleeding  Past Medical History  Diagnosis Date  . Pregnancy   . Substance abuse   . Normal delivery 02/19/2012  . Anxiety   . Mental disorder   . Depression     Past Surgical History  Procedure Laterality Date  . No past surgeries      Family History  Problem Relation Age of Onset  . Alcohol abuse Neg Hx   . Arthritis Neg Hx   . Asthma Neg Hx   . Birth defects Neg Hx   . Cancer Neg Hx   . Depression Neg Hx   . COPD Neg Hx   . Diabetes Neg Hx   . Early death Neg Hx   . Drug abuse Neg Hx   . Hearing loss Neg Hx   . Heart disease Neg Hx   . Hyperlipidemia Neg Hx   . Hypertension Neg Hx   . Kidney disease Neg Hx   . Learning disabilities Neg Hx   . Mental illness Neg Hx   . Mental retardation Neg Hx   . Miscarriages / Stillbirths Neg Hx   . Stroke Neg Hx   . Vision loss Neg Hx     Social History  Substance Use Topics  . Smoking status: Current Every Day Smoker -- 2.00 packs/day for 1 years    Types: Cigarettes  . Smokeless tobacco: None  . Alcohol Use: No     Comment: 8-40oz    Allergies: No Known Allergies  Prescriptions prior to admission  Medication Sig Dispense Refill Last Dose  . docusate sodium (COLACE) 100 MG capsule Take 1 capsule (100 mg total) by mouth every 12 (twelve) hours. 60 capsule 0   . naproxen (NAPROSYN) 500 MG tablet Take 1 tablet (500 mg total) by mouth 2 (two) times daily. (Patient not taking: Reported on 10/24/2014) 30 tablet 0 Not Taking at Unknown time    Review of Systems  Constitutional: Negative for fever.  Gastrointestinal: Positive for abdominal pain.  Skin:   Round circular swollen area with white pimple head Excema  All other systems reviewed and are negative.  Physical Exam   Blood pressure 121/76, pulse 83, temperature 98 F (36.7 C), temperature source Oral, resp. rate 18, last menstrual period 10/08/2014.  Physical Exam  Nursing note and vitals reviewed. Constitutional: She is oriented to person, place, and time. She appears well-developed and well-nourished. No distress.  HENT:  Head: Normocephalic and atraumatic.  Neck: Normal range of motion. Neck supple.  Cardiovascular: Normal rate.   Respiratory: Effort normal. No respiratory distress.  GI: Soft. She exhibits no distension. There is no tenderness.  Musculoskeletal: Normal range of motion.  Neurological: She is alert and oriented to person, place, and time.  Skin: Skin is warm and dry.  2.5cm reddened swollen area with white pimple. Right calf  Psychiatric: She has a normal mood and affect. Her behavior is normal. Judgment and thought content normal.    MAU Course  Procedures  MDM [place] Results for orders placed or performed during the hospital encounter of 01/03/15 (from the past 24 hour(s))  Urine rapid drug screen (hosp performed)     Status: None   Collection Time: 01/03/15 11:25 AM  Result Value Ref Range   Opiates NONE DETECTED NONE DETECTED   Cocaine NONE DETECTED NONE DETECTED   Benzodiazepines NONE DETECTED NONE DETECTED   Amphetamines NONE DETECTED NONE DETECTED   Tetrahydrocannabinol NONE DETECTED NONE DETECTED   Barbiturates NONE DETECTED NONE DETECTED  CBC     Status: Abnormal   Collection Time: 01/03/15 12:15 PM  Result Value Ref Range   WBC 13.6 (H) 4.0 - 10.5 K/uL   RBC 4.58 3.87 - 5.11 MIL/uL   Hemoglobin 13.5 12.0 - 15.0 g/dL   HCT 11.937.6 14.736.0 - 82.946.0 %   MCV 82.1 78.0 - 100.0 fL   MCH 29.5 26.0 - 34.0 pg   MCHC 35.9 30.0 - 36.0 g/dL   RDW 56.212.7 13.011.5 - 86.515.5 %   Platelets 346 150 - 400 K/uL  hCG, quantitative, pregnancy     Status: Abnormal    Collection Time: 01/03/15 12:15 PM  Result Value Ref Range   hCG, Beta Chain, Quant, S [id number] (H) <5 mIU/mL  Wet prep, genital     Status: None   Collection Time: 01/03/15 12:30 PM  Result Value Ref Range   Yeast Wet Prep HPF POC NONE SEEN NONE SEEN   Trich, Wet Prep NONE SEEN NONE SEEN   Clue Cells Wet Prep HPF POC NONE SEEN NONE SEEN   WBC, Wet Prep HPF POC NONE SEEN NONE SEEN  [place] Ob Comp Less 14 Wks  01/03/2015  CLINICAL DATA:  Initial encounter for pain 1 week history of intermittent sharp pelvic pain with positive pregnancy test. EXAM: OBSTETRIC <14 WK US AND TRANSVAGINAL OB US TECHNIQUE: Both transabdominal and transvaginal ultrasound examinations were performed for complete evaluation of the gestation as well as the maternal uterus, adnexal regions, and pelvic cul-de-sac. Transvaginal technique was performed to assess early pregnancy. COMPARISON:  None. FINDINGS: Intrauterine gestational sac: Visualized/normal in shape. Yolk sac:  Not visualized Embryo:  Visualized Cardiac Activity: Visualized Heart Rate: 150  bpm CRL:  60.5  mm   12 w   4 d                  US EDC: 07/14/2015. Maternal uterus/adnexae: No evidence for adnexal mass. Corpus luteum cyst identified in the right ovary. Possible right uterine fibroid. IMPRESSION: Single living intrauterine gestation at estimated 12 week 4 day gestational age by crown-rump length. Electronically Signed   By: Kennith CenterEric  Mansell M.D.   On: 01/03/2015 13:37   Spoke to Dr Adrian BlackwaterStinson re POC for possible bite. Keflex 500mg  QID x 7; triamcinoclone for excema Assessment and Plan  Abdominal pain in pregnancy Excema Insect Bite Discharge to home  Clemmons,Lori Grissett 01/03/2015, 1:59 PM

## 2015-01-03 NOTE — Discharge Instructions (Signed)
Spider Bite Spider bites are not common. When spider bites do happen, most do not cause serious health problems. There are only a few types of spider bites that can cause serious health problems. CAUSES A spider bite usually happens when a person accidentally makes contact with a spider in a way that traps the spider against the person's skin. SYMPTOMS Symptoms may vary depending on the type of spider. Some spider bites may cause symptoms within 1 hour after the bite. For other spider bites, it may take 1-2 days for symptoms to develop. Common symptoms include:  Redness and swelling in the area of the bite.  Discomfort or pain in the area of the bite. A few types of spiders, such as the black widow spider or the brown recluse spider, can inject poison (venom) into a bite wound. This venom causes more serious symptoms. Symptoms of a venomous spider bite vary, and may include:  Muscle cramps.  Nausea, vomiting, or abdominal pain.  Fever.  A skin sore (lesion) that spreads. This can break into an open wound (skin ulcer).  Light-headedness or dizziness. DIAGNOSIS This condition may be diagnosed based on your symptoms and a physical exam. Your health care provider will ask about the history of your injury and any details you may have about the spider. This may help to determine what type of spider it was that bit you. TREATMENT Many spider bites do not require treatment. If needed, treatment may include:  Icing and keeping the bite area raised (elevated).  Over-the-counter or prescription medicines to help control symptoms.  A tetanus shot.  Antibiotic medicines. HOME CARE INSTRUCTIONS Medicines  Take or apply over-the-counter and prescription medicines only as told by your health care provider.  If you were prescribed an antibiotic medicine, take or apply it as told by your health care provider. Do not stop using the antibiotic even if your condition improves. General  Instructions  Do not scratch the bite area.  Keep the bite area clean and dry. Wash the bite area daily with soap and water as told by your health care provider.  If directed, apply ice to the bite area.  Put ice in a plastic bag.  Place a towel between your skin and the bag.  Leave the ice on for 20 minutes, 2-3 times per day.  Elevate the affected area above the level of your heart while you are sitting or lying down, if possible.  Keep all follow-up visits as told by your health care provider. This is important. SEEK MEDICAL CARE IF:  Your bite does not get better after 3 days of treatment.  Your bite turns black or purple.  You have increased redness, swelling, or pain at the site of the bite. SEEK IMMEDIATE MEDICAL CARE IF:  You develop shortness of breath or chest pain.  You have fluid, blood, or pus coming from the bite area.  You have muscle cramps or painful muscle spasms.  You develop abdominal pain, nausea, or vomiting.  You feel unusually tired (fatigued) or sleepy.   This information is not intended to replace advice given to you by your health care provider. Make sure you discuss any questions you have with your health care provider.   Document Released: 03/29/2004 Document Revised: 11/10/2014 Document Reviewed: 07/07/2014 Elsevier Interactive Patient Education 2016 [employer] Prenatal Care Providers [facility]    [facility]  [phone]     Phone: [phone]  [facility] [place]   [place]     Phone: [phone]  Phone: [phone]         [facility]     Phone: [phone]  Phone: [phone]           [facility] [place]                hone: [phone]  Phone: [phone]         [facility] [address] Francoise Ceo      Phone: [phone]  Phone:  [phone]         [facility]                Phone: [phone]  [facility]   [address] [place])          Phone: [phone] [facility]   Phone: [phone]

## 2015-01-04 LAB — HIV ANTIBODY (ROUTINE TESTING W REFLEX): HIV Screen 4th Generation wRfx: NONREACTIVE

## 2015-01-10 LAB — OB RESULTS CONSOLE PLATELET COUNT: PLATELETS: 322 10*3/uL

## 2015-01-10 LAB — OB RESULTS CONSOLE VARICELLA ZOSTER ANTIBODY, IGG: Varicella: IMMUNE

## 2015-01-10 LAB — OB RESULTS CONSOLE GC/CHLAMYDIA
CHLAMYDIA, DNA PROBE: NEGATIVE
GC PROBE AMP, GENITAL: NEGATIVE

## 2015-01-10 LAB — OB RESULTS CONSOLE HEPATITIS B SURFACE ANTIGEN: Hepatitis B Surface Ag: NEGATIVE

## 2015-01-10 LAB — OB RESULTS CONSOLE RPR: RPR: NONREACTIVE

## 2015-01-10 LAB — OB RESULTS CONSOLE RUBELLA ANTIBODY, IGM: RUBELLA: NON-IMMUNE/NOT IMMUNE

## 2015-01-10 LAB — OB RESULTS CONSOLE ANTIBODY SCREEN: Antibody Screen: NEGATIVE

## 2015-01-10 LAB — OB RESULTS CONSOLE HGB/HCT, BLOOD
HCT: 36 %
Hemoglobin: 12.4 g/dL

## 2015-01-10 LAB — OB RESULTS CONSOLE ABO/RH: RH Type: POSITIVE

## 2015-01-10 LAB — OB RESULTS CONSOLE HIV ANTIBODY (ROUTINE TESTING): HIV: NONREACTIVE

## 2015-01-16 ENCOUNTER — Encounter (HOSPITAL_COMMUNITY): Payer: Self-pay

## 2015-01-16 ENCOUNTER — Inpatient Hospital Stay (HOSPITAL_COMMUNITY)
Admission: AD | Admit: 2015-01-16 | Discharge: 2015-01-16 | Disposition: A | Discharge status: Home / Self Care | Payer: Medicaid Other | Source: Ambulatory Visit | Attending: Obstetrics & Gynecology | Admitting: Obstetrics & Gynecology

## 2015-01-16 DIAGNOSIS — O26892 Other specified pregnancy related conditions, second trimester: Secondary | ICD-10-CM | POA: Diagnosis not present

## 2015-01-16 DIAGNOSIS — L03116 Cellulitis of left lower limb: Secondary | ICD-10-CM

## 2015-01-16 DIAGNOSIS — F17210 Nicotine dependence, cigarettes, uncomplicated: Secondary | ICD-10-CM | POA: Diagnosis not present

## 2015-01-16 DIAGNOSIS — T63301A Toxic effect of unspecified spider venom, accidental (unintentional), initial encounter: Secondary | ICD-10-CM | POA: Diagnosis not present

## 2015-01-16 DIAGNOSIS — O99332 Smoking (tobacco) complicating pregnancy, second trimester: Secondary | ICD-10-CM | POA: Insufficient documentation

## 2015-01-16 DIAGNOSIS — W57XXXA Bitten or stung by nonvenomous insect and other nonvenomous arthropods, initial encounter: Secondary | ICD-10-CM

## 2015-01-16 DIAGNOSIS — Z3A14 14 weeks gestation of pregnancy: Secondary | ICD-10-CM | POA: Insufficient documentation

## 2015-01-16 MED ORDER — CEPHALEXIN 500 MG PO CAPS: 500.0000 mg | ORAL_CAPSULE | Freq: Three times a day (TID) | ORAL | Status: DC

## 2015-01-16 NOTE — Discharge Instructions (Signed)

## 2015-01-16 NOTE — MAU Provider Note (Signed)
History     CSN: [id number]  Arrival date and time: 01/16/15 1525   First Provider Initiated Contact with Patient 01/16/15 1553      Chief Complaint  Patient presents with  . Insect Bite   HPI 29 y.o. [id number] at [redacted]w[redacted]d w/ "spider bites" to left leg. Patient reports new itchy/painful red bumps on left leg, was here w/ same complaint on her right calf 2 weeks ago. Patient lives in shelter, states she has not seen any spiders or bugs and no other residents are getting bitten.   Past Medical History  Diagnosis Date  . Pregnancy   . Substance abuse   . Normal delivery 02/19/2012  . Anxiety   . Mental disorder   . Depression     Past Surgical History  Procedure Laterality Date  . No past surgeries      Family History  Problem Relation Age of Onset  . Alcohol abuse Neg Hx   . Arthritis Neg Hx   . Asthma Neg Hx   . Birth defects Neg Hx   . Cancer Neg Hx   . Depression Neg Hx   . COPD Neg Hx   . Diabetes Neg Hx   . Early death Neg Hx   . Drug abuse Neg Hx   . Hearing loss Neg Hx   . Heart disease Neg Hx   . Hyperlipidemia Neg Hx   . Hypertension Neg Hx   . Kidney disease Neg Hx   . Learning disabilities Neg Hx   . Mental illness Neg Hx   . Mental retardation Neg Hx   . Miscarriages / Stillbirths Neg Hx   . Stroke Neg Hx   . Vision loss Neg Hx     Social History  Substance Use Topics  . Smoking status: Current Every Day Smoker -- 2.00 packs/day for 1 years    Types: Cigarettes  . Smokeless tobacco: None  . Alcohol Use: No     Comment: 8-40oz    Allergies: No Known Allergies  Prescriptions prior to admission  Medication Sig Dispense Refill Last Dose  . docusate sodium (COLACE) 100 MG capsule Take 1 capsule (100 mg total) by mouth every 12 (twelve) hours. 60 capsule 0   . triamcinolone cream (KENALOG) 0.5 % Apply 1 application topically 3 (three) times daily. 30 g 0     Review of Systems  Constitutional: Negative.   Respiratory: Negative.     Cardiovascular: Negative.   Gastrointestinal: Negative for nausea, vomiting, abdominal pain, diarrhea and constipation.  Genitourinary: Negative for dysuria, urgency, frequency, hematuria and flank pain.       Negative for vaginal bleeding, cramping/contractions  Musculoskeletal: Negative.   Neurological: Negative.   Psychiatric/Behavioral: Negative.    Physical Exam   Blood pressure 126/81, pulse 98, temperature 98.2 F (36.8 C), temperature source Oral, resp. rate 18, last menstrual period 10/08/2014.  Physical Exam  Nursing note and vitals reviewed. Constitutional: She is oriented to person, place, and time. She appears well-developed and well-nourished. No distress.  Cardiovascular: Normal rate.   Respiratory: Effort normal.  Musculoskeletal: Normal range of motion.  Neurological: She is alert and oriented to person, place, and time.  Skin: Skin is warm and dry.     Psychiatric: She has a normal mood and affect.    MAU Course  Procedures    Assessment and Plan   1. Insect bite   2. Cellulitis of leg, left   Lesions c/w insect bites to left leg, minimal surrounding  erythema and tenderness, rx keflex, f/u w/ worsening symptoms.    Medication List    TAKE these medications        cephALEXin 500 MG capsule  Commonly known as:  KEFLEX  Take 1 capsule (500 mg total) by mouth 3 (three) times daily.     docusate sodium 100 MG capsule  Commonly known as:  COLACE  Take 1 capsule (100 mg total) by mouth every 12 (twelve) hours.     prenatal multivitamin Tabs tablet  Take 1 tablet by mouth daily at 12 noon.     ranitidine 150 MG tablet  Commonly known as:  ZANTAC  Take 150 mg by mouth at bedtime.     triamcinolone cream 0.5 %  Commonly known as:  KENALOG  Apply 1 application topically 3 (three) times daily.            Follow-up Information    [facility].   Specialty:  Obstetrics and Gynecology   Why:  as  scheduled or sooner as needed   Contact information:   [address] [place] [id number] [phone]        FRAZIER,NATALIE 01/16/2015, 3:53 PM

## 2015-01-16 NOTE — MAU Note (Signed)
Patient presents with left lower extremity bites thinks from spiders.

## 2015-01-19 ENCOUNTER — Encounter (HOSPITAL_COMMUNITY): Payer: Self-pay | Admitting: Emergency Medicine

## 2015-01-19 ENCOUNTER — Emergency Department (HOSPITAL_COMMUNITY)
Admission: EM | Admit: 2015-01-19 | Discharge: 2015-01-19 | Disposition: A | Discharge status: Home / Self Care | Payer: Medicaid Other | Attending: Emergency Medicine | Admitting: Emergency Medicine

## 2015-01-19 DIAGNOSIS — L0291 Cutaneous abscess, unspecified: Secondary | ICD-10-CM

## 2015-01-19 DIAGNOSIS — O99712 Diseases of the skin and subcutaneous tissue complicating pregnancy, second trimester: Secondary | ICD-10-CM | POA: Diagnosis not present

## 2015-01-19 DIAGNOSIS — Z87891 Personal history of nicotine dependence: Secondary | ICD-10-CM | POA: Insufficient documentation

## 2015-01-19 DIAGNOSIS — Z7952 Long term (current) use of systemic steroids: Secondary | ICD-10-CM | POA: Insufficient documentation

## 2015-01-19 DIAGNOSIS — Z8659 Personal history of other mental and behavioral disorders: Secondary | ICD-10-CM | POA: Diagnosis not present

## 2015-01-19 DIAGNOSIS — Z79899 Other long term (current) drug therapy: Secondary | ICD-10-CM | POA: Diagnosis not present

## 2015-01-19 DIAGNOSIS — Z792 Long term (current) use of antibiotics: Secondary | ICD-10-CM | POA: Insufficient documentation

## 2015-01-19 DIAGNOSIS — L02416 Cutaneous abscess of left lower limb: Secondary | ICD-10-CM | POA: Insufficient documentation

## 2015-01-19 MED ORDER — ACETAMINOPHEN 500 MG PO TABS: 500.0000 mg | ORAL_TABLET | Freq: Four times a day (QID) | ORAL | Status: AC | PRN

## 2015-01-19 MED ORDER — LIDOCAINE HCL (PF) 1 % IJ SOLN
5.0000 mL | Freq: Once | INTRAMUSCULAR | Status: DC
  Filled 2015-01-19: qty 5

## 2015-01-19 MED ORDER — LIDOCAINE HCL (PF) 1 % IJ SOLN
5.0000 mL | Freq: Once | INTRAMUSCULAR | Status: AC
  Administered 2015-01-19: 5 mL

## 2015-01-19 NOTE — Discharge Instructions (Signed)
Incision and Drainage Incision and drainage is a procedure in which a sac-like structure (cystic structure) is opened and drained. The area to be drained usually contains material such as pus, fluid, or blood.  LET YOUR CAREGIVER KNOW ABOUT:   Allergies to medicine.  Medicines taken, including vitamins, herbs, eyedrops, over-the-counter medicines, and creams.  Use of steroids (by mouth or creams).  Previous problems with anesthetics or numbing medicines.  History of bleeding problems or blood clots.  Previous surgery.  Other health problems, including diabetes and kidney problems.  Possibility of pregnancy, if this applies. RISKS AND COMPLICATIONS  Pain.  Bleeding.  Scarring.  Infection. BEFORE THE PROCEDURE  You may need to have an ultrasound or other imaging tests to see how large or deep your cystic structure is. Blood tests may also be used to determine if you have an infection or how severe the infection is. You may need to have a tetanus shot. PROCEDURE  The affected area is cleaned with a cleaning fluid. The cyst area will then be numbed with a medicine (local anesthetic). A small incision will be made in the cystic structure. A syringe or catheter may be used to drain the contents of the cystic structure, or the contents may be squeezed out. The area will then be flushed with a cleansing solution. After cleansing the area, it is often gently packed with a gauze or another wound dressing. Once it is packed, it will be covered with gauze and tape or some other type of wound dressing. AFTER THE PROCEDURE   Often, you will be allowed to go home right after the procedure.  You may be given antibiotic medicine to prevent or heal an infection.  If the area was packed with gauze or some other wound dressing, you will likely need to come back in 1 to 2 days to get it removed.  The area should heal in about 14 days.   This information is not intended to replace advice given  to you by your health care provider. Make sure you discuss any questions you have with your health care provider.   Document Released: 08/15/2000 Document Revised: 08/21/2011 Document Reviewed: 04/16/2011 Elsevier Interactive Patient Education [employer]2016 [employer]  Please continue monitor for signs of infection, return relief any present. Please follow-up with her primary care in 2 days for reevaluation of wound.

## 2015-01-19 NOTE — ED Notes (Signed)
SEE PA \'s note for skin assessment.

## 2015-01-19 NOTE — ED Notes (Signed)
Declined W/C at D/C and was escorted to lobby by RN. 

## 2015-01-19 NOTE — ED Provider Notes (Signed)
CSN: [id number]     Arrival date & time 01/19/15  1315 History  By signing my name below, I, [name] [name], attest that this documentation has been prepared under the direction and in the presence of Eyvonne Mechanic, PA-C Electronically Signed: Charline Bills, ED Scribe 01/19/2015 at 2:56 PM.   Chief Complaint  Patient presents with  . Recurrent Skin Infections   The history is provided by the patient. No language interpreter was used.   HPI Comments: [name] [name] is a 29 y.o. female, currently 3 months pregnant, who presents to the Emergency Department complaining of 2 gradually worsening, painful abscesses to the left lower extremity. Pt reports constant, 7/10 pain that is exacerbated with palpation. She reports associated HA. Pt has tried previously prescribed Keflex, warm compresses and applying pressure. She denies fever, nausea, vomiting. Pt reports h/o previous abscess to right lower extremity that resolved with Keflex and another abscess to right groin which required lancing. She also reports h/o eczema.  Past Medical History  Diagnosis Date  . Pregnancy   . Substance abuse   . Normal delivery 02/19/2012  . Anxiety   . Mental disorder   . Depression    Past Surgical History  Procedure Laterality Date  . No past surgeries     Family History  Problem Relation Age of Onset  . Alcohol abuse Neg Hx   . Arthritis Neg Hx   . Asthma Neg Hx   . Birth defects Neg Hx   . Cancer Neg Hx   . Depression Neg Hx   . COPD Neg Hx   . Diabetes Neg Hx   . Early death Neg Hx   . Drug abuse Neg Hx   . Hearing loss Neg Hx   . Heart disease Neg Hx   . Hyperlipidemia Neg Hx   . Hypertension Neg Hx   . Kidney disease Neg Hx   . Learning disabilities Neg Hx   . Mental illness Neg Hx   . Mental retardation Neg Hx   . Miscarriages / Stillbirths Neg Hx   . Stroke Neg Hx   . Vision loss Neg Hx    Social History  Substance Use Topics  . Smoking status: Former Smoker -- 2.00 packs/day for 1  years    Types: Cigarettes  . Smokeless tobacco: None  . Alcohol Use: No     Comment: 8-40oz   OB History    Gravida Para Term Preterm AB TAB SAB Ectopic Multiple Living       Review of Systems  All other systems reviewed and are negative.  Allergies  Review of patient's allergies indicates no known allergies.  Home Medications   Prior to Admission medications   Medication Sig Start Date End Date Taking? Authorizing Provider  acetaminophen (TYLENOL) 500 MG tablet Take 1 tablet (500 mg total) by mouth every 6 (six) hours as needed. 01/19/15   Eyvonne Mechanic, PA-C  cephALEXin (KEFLEX) 500 MG capsule Take 1 capsule (500 mg total) by mouth 3 (three) times daily. 01/16/15   Archie Patten, CNM  docusate sodium (COLACE) 100 MG capsule Take 1 capsule (100 mg total) by mouth every 12 (twelve) hours. 12/24/14   Derwood Kaplan, MD  Prenatal Vit-Fe Fumarate-FA (PRENATAL MULTIVITAMIN) TABS tablet Take 1 tablet by mouth daily at 12 noon.    Historical Provider, MD  ranitidine (ZANTAC) 150 MG tablet Take 150 mg by mouth at bedtime.    Historical  Provider, MD  triamcinolone cream (KENALOG) 0.5 % Apply 1 application topically 3 (three) times daily. 01/03/15   Lori A Clemmons, CNM   BP 120/84 mmHg  Pulse 88  Temp(Src) 98.6 F (37 C) (Oral)  Resp 18  Ht 5\' 7"  (1.702 m)  Wt 245 lb (111.131 kg)  BMI 38.36 kg/m2  SpO2 99%  LMP 10/08/2014  Physical Exam  Constitutional: She is oriented to person, place, and time. She appears well-developed and well-nourished. No distress.  HENT:  Head: Normocephalic and atraumatic.  Eyes: Conjunctivae and EOM are normal.  Neck: Neck supple. No tracheal deviation present.  Cardiovascular: Normal rate.   Pulmonary/Chest: Effort normal. No respiratory distress.  Musculoskeletal: Normal range of motion.  Neurological: She is alert and oriented to person, place, and time.  Skin: Skin is warm and dry.  2 areas of redness, induration and  tenderness to L LE. No obvious fluctuance felt. approx .5 cm in width   Psychiatric: She has a normal mood and affect. Her behavior is normal.  Nursing note and vitals reviewed.  ED Course  Procedures (including critical care time) DIAGNOSTIC STUDIES: Oxygen Saturation is 99% on RA, normal by my interpretation.    EMERGENCY DEPARTMENT US SOFT TISSUE INTERPRETATION "Study: Limited Ultrasound of the noted body part in comments below"  INDICATIONS: Soft tissue infection Multiple views of the body part are obtained with a multi-frequency linear probe  PERFORMED BY:  Myself  IMAGES ARCHIVED?: Yes  SIDE:Left  BODY PART:Lower extremity  FINDINGS: Abcess present  LIMITATIONS:  Body Habitus  INTERPRETATION:  Abcess present  COMMENT:    COORDINATION OF CARE: 2:37 PM-Discussed treatment plan which includes US and I&D with pt at bedside and pt agreed to plan.   INCISION AND DRAINAGE PROCEDURE NOTE: Patient identification was confirmed and verbal consent was obtained. This procedure was performed by Eyvonne MechanicJeffrey Ashlynne Shetterly, PA-C at 2:42 PM. Site: left lower extremity Sterile procedures observed Needle size: 25 Anesthetic used (type and amt): 2mL of 1% lidocaine Blade size: 11 Drainage: 0.5 cc of purulent drainage  Complexity: Complex No packing used Site anesthetized, incision made over site, wound drained and explored loculations, rinsed with copious amounts of normal saline, wound packed with sterile gauze, covered with dry, sterile dressing. Pt tolerated procedure well without complications. Instructions for care discussed verbally and pt provided with additional written instructions for homecare and f/u.  Labs Review Labs Reviewed - No data to display  Imaging Review No results found. I have personally reviewed and evaluated these images and lab results as part of my medical decision-making.   EKG Interpretation None      MDM   Final diagnoses:  Abscess     Labs:  Imaging: US  Consults:  Therapeutics:  Discharge Meds: Tylenol  Assessment/Plan: Patient presents with 2 areas of developing abscess. Bedside open Showed One Area of Obvious Abscess Formation, Discussed Options Including Warm Compresses with Patient and She Requested That I&D. Performed. I&D Showed Minimal Purulent Discharge. She Was Given Care Instructions, Instructions to Follow-Up in 2-3 Days for Reevaluation, Return Immediately If Any New or Worsening Signs or Symptoms Present. Patient verbalized understanding and agreement to today's plan and had no further questions or concerns at the time of discharge  I personally performed the services described in this documentation, which was scribed in my presence. The recorded information has been reviewed and is accurate.    Eyvonne MechanicJeffrey Shamyia Grandpre, PA-C 01/19/15 1504  Gwyneth SproutWhitney Plunkett, MD 01/20/15 [phone]

## 2015-01-19 NOTE — ED Notes (Signed)
2 boils on LLE, no other complaints, is also pregnant, no complaints related to pregnancy, has pre natal care, NAD

## 2015-01-24 ENCOUNTER — Encounter: Payer: Self-pay | Admitting: Obstetrics and Gynecology

## 2015-01-24 ENCOUNTER — Ambulatory Visit (INDEPENDENT_AMBULATORY_CARE_PROVIDER_SITE_OTHER): Payer: Medicaid Other | Admitting: Obstetrics and Gynecology

## 2015-01-24 ENCOUNTER — Encounter: Payer: Self-pay | Admitting: *Deleted

## 2015-01-24 VITALS — BP 115/72 | HR 81 | Temp 98.0°F | Wt 249.2 lb

## 2015-01-24 DIAGNOSIS — O0990 Supervision of high risk pregnancy, unspecified, unspecified trimester: Secondary | ICD-10-CM

## 2015-01-24 DIAGNOSIS — O99322 Drug use complicating pregnancy, second trimester: Secondary | ICD-10-CM

## 2015-01-24 DIAGNOSIS — D573 Sickle-cell trait: Secondary | ICD-10-CM

## 2015-01-24 DIAGNOSIS — O99012 Anemia complicating pregnancy, second trimester: Secondary | ICD-10-CM

## 2015-01-24 DIAGNOSIS — Z8751 Personal history of pre-term labor: Secondary | ICD-10-CM | POA: Insufficient documentation

## 2015-01-24 DIAGNOSIS — F1910 Other psychoactive substance abuse, uncomplicated: Secondary | ICD-10-CM

## 2015-01-24 DIAGNOSIS — O0992 Supervision of high risk pregnancy, unspecified, second trimester: Secondary | ICD-10-CM | POA: Diagnosis not present

## 2015-01-24 DIAGNOSIS — O99320 Drug use complicating pregnancy, unspecified trimester: Secondary | ICD-10-CM

## 2015-01-24 LAB — POCT URINALYSIS DIP (DEVICE)
BILIRUBIN URINE: NEGATIVE
GLUCOSE, UA: NEGATIVE mg/dL
Hgb urine dipstick: NEGATIVE
KETONES UR: NEGATIVE mg/dL
NITRITE: NEGATIVE
PROTEIN: NEGATIVE mg/dL
Specific Gravity, Urine: 1.02 (ref 1.005–1.030)
Urobilinogen, UA: 0.2 mg/dL (ref 0.0–1.0)
pH: 7 (ref 5.0–8.0)

## 2015-01-24 MED ORDER — TRIAMCINOLONE ACETONIDE 0.5 % EX OINT
1.0000 | TOPICAL_OINTMENT | Freq: Two times a day (BID) | CUTANEOUS | Status: AC
Start: 2015-01-24 — End: ?

## 2015-01-24 MED ORDER — SUCRALFATE 1 G PO TABS: 1.0000 g | ORAL_TABLET | Freq: Three times a day (TID) | ORAL | Status: AC | PRN

## 2015-01-24 NOTE — Progress Notes (Signed)
Initial prenatal education packet given Breastfeeding tip of the Week reviewed

## 2015-01-24 NOTE — Progress Notes (Signed)
Subjective:  [name] [name] is a 29 y.o. [id number] at 1247w3d being seen today for initial prenatal care.  She is currently monitored for the following issues for this high-risk pregnancy: Patient Active Problem List   Diagnosis Date Noted  . Supervision of high-risk pregnancy 01/24/2015  . History of preterm labor 01/24/2015  . Depressive disorder 10/08/2013  . Right orbit fracture (HCC) 09/14/2013  . 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 Substance abuse complicating pregnancy, antepartum 08/12/2011  . PTSD (post-traumatic stress disorder) 08/12/2011   Patient reports no complaints.  Contractions: Not present. Vag. Bleeding: None.   . Denies leaking of fluid.   The following portions of the patient's history were reviewed and updated as appropriate: allergies, current medications, past family history, past medical history, past social history, past surgical history and problem list. Problem list updated.  Objective:   Filed Vitals:   01/24/15 1139  BP: 115/72  Pulse: 81  Temp: 98 F (36.7 C)  Weight: 249 lb 3.2 oz (113.036 kg)    Fetal Status: Fetal Heart Rate (bpm): 145         General:  Alert, oriented and cooperative. Patient is in no acute distress.  Skin: Skin is warm and dry. No rash noted.   Cardiovascular: Normal heart rate noted  Respiratory: Normal respiratory effort, no problems with respiration noted  Abdomen: Soft, gravid, appropriate for gestational age. Pain/Pressure: Present     Pelvic: Vag. Bleeding: None     Cervical exam deferred        Extremities: Normal range of motion.  Edema: None  Mental Status: Normal mood and affect. Normal behavior. Normal judgment and thought content.   Urinalysis: Urine Protein: Negative Urine Glucose: Negative  Assessment and Plan:  Pregnancy: [id number] at 1247w3d  # Pregnancy - per pt has had all labs done at [facility], those results are pending - ordering anatomy scan  # alcohol, cocaine use, homelessness ([facility] - sw today - congratulated on abstinence - UDS  # GERD - add sucralfate to zantac  # Eczema - increasing triamcinolone cream to ointment  As pt says cream not sufficient. Has mild eczema flexor surfaces of arms  # hx pprom - makenna paperwork today  # sickle cell trait - recommending FOB get tested - patient unsure she can contact him but will try  There are no diagnoses linked to this encounter. Preterm labor symptoms and general obstetric precautions including but not limited to vaginal bleeding, contractions, leaking of fluid and fetal movement were reviewed in detail with the patient. Please refer to After Visit Summary for other counseling recommendations.  F/u 4 wks  [name] RunningNoah Bedford [name], MD

## 2015-01-25 LAB — PRESCRIPTION MONITORING PROFILE (19 PANEL)
AMPHETAMINE/METH: NEGATIVE ng/mL
BARBITURATE SCREEN, URINE: NEGATIVE ng/mL
BENZODIAZEPINE SCREEN, URINE: NEGATIVE ng/mL
BUPRENORPHINE, URINE: NEGATIVE ng/mL
Cannabinoid Scrn, Ur: NEGATIVE ng/mL
Carisoprodol, Urine: NEGATIVE ng/mL
Cocaine Metabolites: NEGATIVE ng/mL
Creatinine, Urine: 94.67 mg/dL (ref >20.0)
Fentanyl, Ur: NEGATIVE ng/mL
MDMA URINE: NEGATIVE ng/mL
METHADONE SCREEN, URINE: NEGATIVE ng/mL
METHAQUALONE SCREEN (URINE): NEGATIVE ng/mL
Meperidine, Ur: NEGATIVE ng/mL
Nitrites, Initial: NEGATIVE ug/mL
OPIATE SCREEN, URINE: NEGATIVE ng/mL
Oxycodone Screen, Ur: NEGATIVE ng/mL
PHENCYCLIDINE, UR: NEGATIVE ng/mL
Propoxyphene: NEGATIVE ng/mL
TAPENTADOLUR: NEGATIVE ng/mL
Tramadol Scrn, Ur: NEGATIVE ng/mL
Zolpidem, Urine: NEGATIVE ng/mL
pH, Initial: 7 pH (ref 4.5–8.9)

## 2015-01-26 ENCOUNTER — Telehealth: Payer: Self-pay | Admitting: *Deleted

## 2015-01-26 NOTE — Telephone Encounter (Signed)
Received [name] [name]'s makena. Called her to make an appointment to start next week. Left message with a female to have her call today by 4 or Monday to make an appointment.

## 2015-01-31 NOTE — Telephone Encounter (Signed)
Called Morrie Sheldonshley and left a message we are trying to reach you to schedule an appointment- please call the clinic.

## 2015-02-02 NOTE — Telephone Encounter (Signed)
Called number listed and a female answered and said this was the transportation line but she would give Morrie Sheldonshley the message to call the clinic.

## 2015-02-07 ENCOUNTER — Encounter: Payer: Self-pay | Admitting: *Deleted

## 2015-02-07 NOTE — Telephone Encounter (Signed)
Letter to patient

## 2015-02-23 ENCOUNTER — Encounter: Payer: Self-pay | Admitting: Advanced Practice Midwife

## 2015-02-23 ENCOUNTER — Ambulatory Visit (HOSPITAL_COMMUNITY): Payer: Medicaid Other | Attending: Obstetrics and Gynecology

## 2015-03-02 ENCOUNTER — Encounter: Payer: Self-pay | Admitting: Obstetrics and Gynecology

## 2015-06-11 ENCOUNTER — Encounter: Payer: Self-pay | Admitting: *Deleted

## 2015-09-01 IMAGING — CT CT HEAD W/O CM
3 of 9 series · 15 of 40 positions shown, 17 images · non-contrast
Comparison: None.

CLINICAL DATA: Trauma.

EXAM:
CT HEAD WITHOUT CONTRAST
CT MAXILLOFACIAL WITHOUT CONTRAST
CT CERVICAL SPINE WITHOUT CONTRAST
TECHNIQUE: Multidetector CT imaging of the head, cervical spine, and
maxillofacial structures were performed using the standard protocol
without intra
venous contrast. Multiplanar CT image reconstructions of the
cervical spine and maxillofacial structures were also generated.

[Series 11: axial · axial · 0.23mm/px · z∈[-364,-184]mm · 8 of 139 slices shown, 10 images]
[im 16/139  brain]
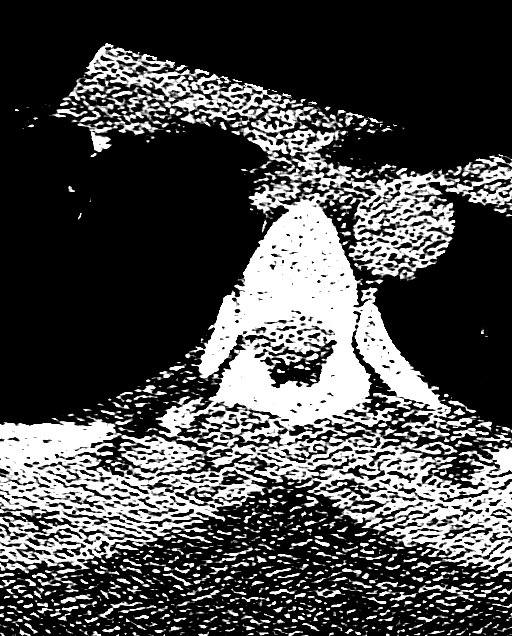
[im 16/139  bone]
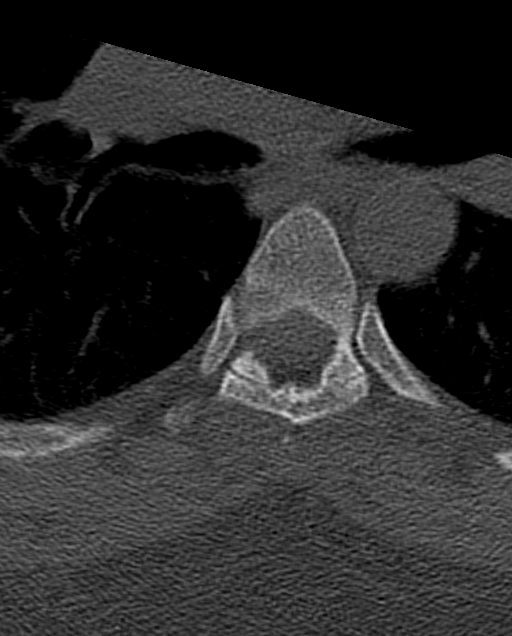
[im 31/139  brain]
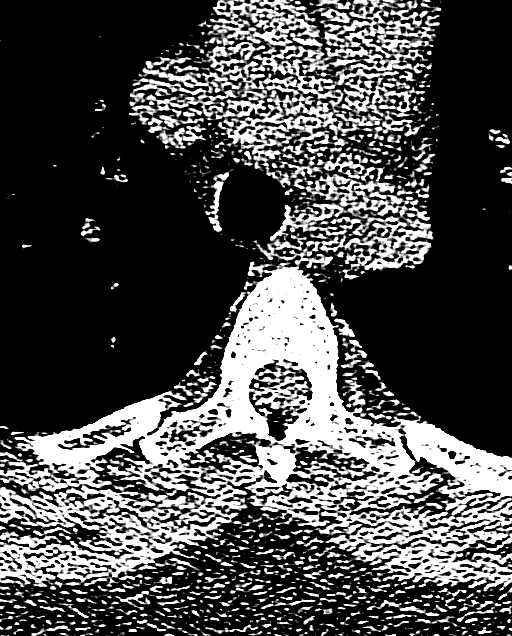
[im 47/139  brain]
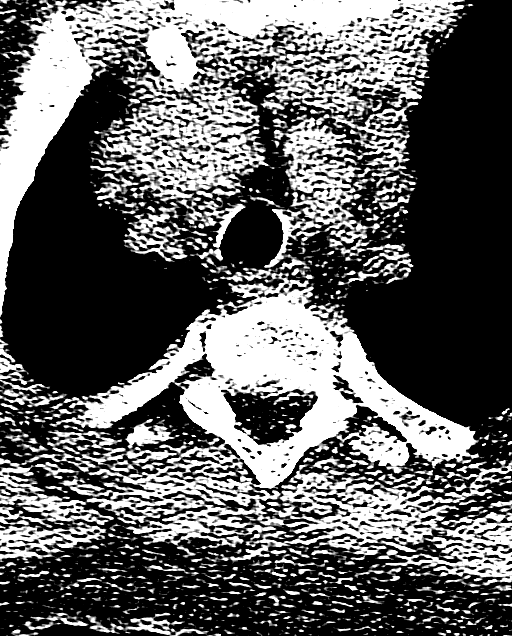
[im 62/139  brain]
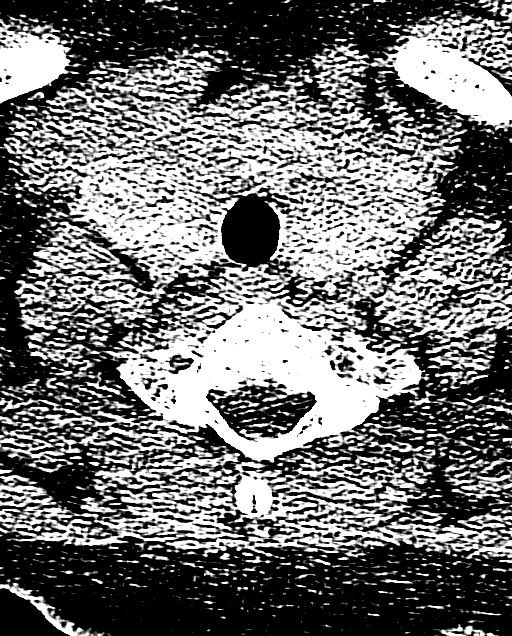
[im 77/139  brain]
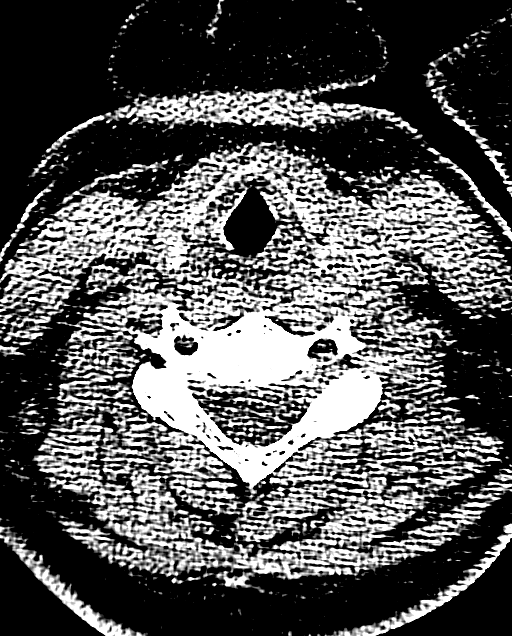
[im 77/139  bone]
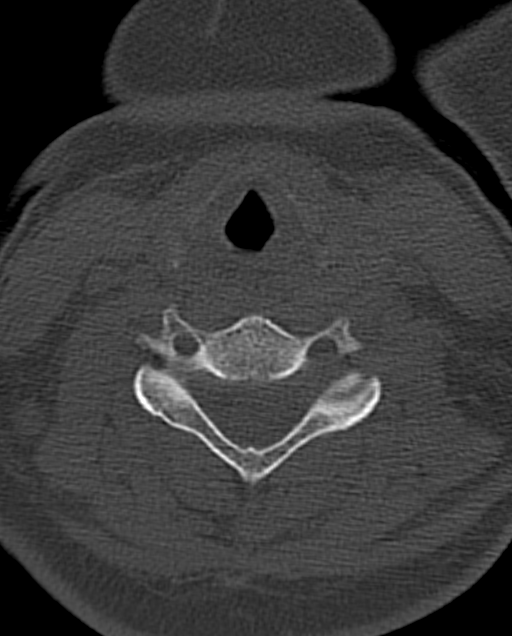
[im 93/139  brain]
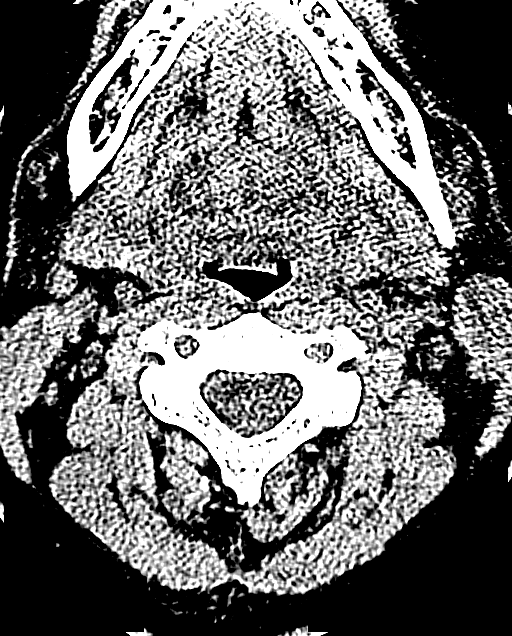
[im 108/139  brain]
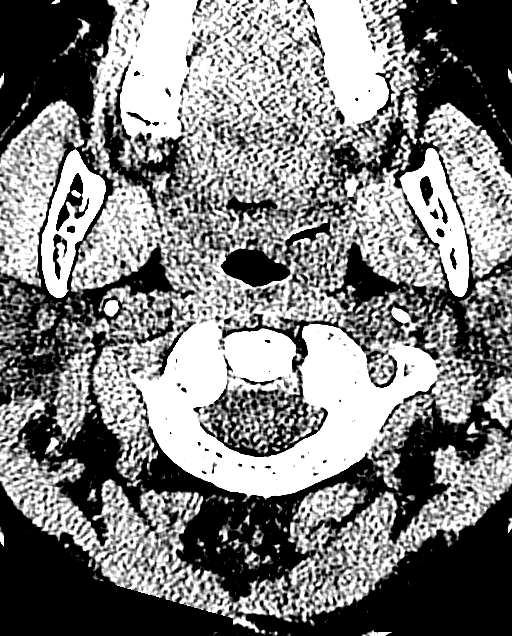
[im 123/139  brain]
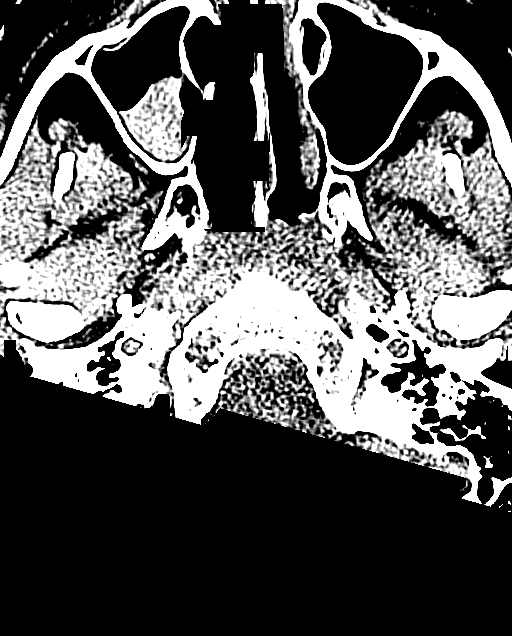

[Series 13: facial st · axial · 0.35mm/px · z∈[-252,-150]mm · 4 of 87 slices shown]
[im 18/87  brain]
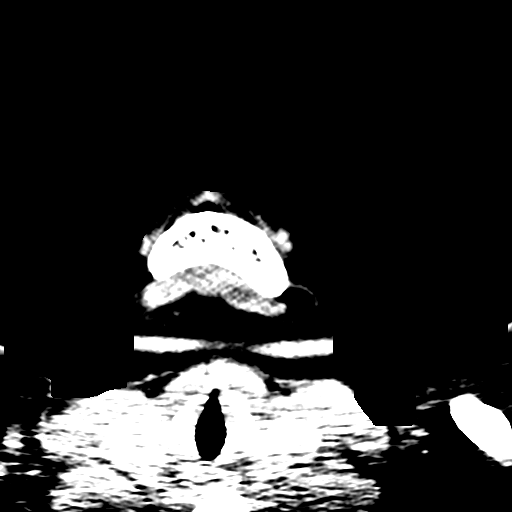
[im 35/87  brain]
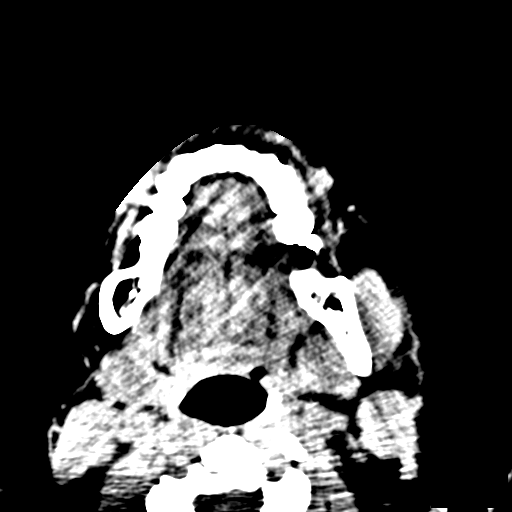
[im 52/87  brain]
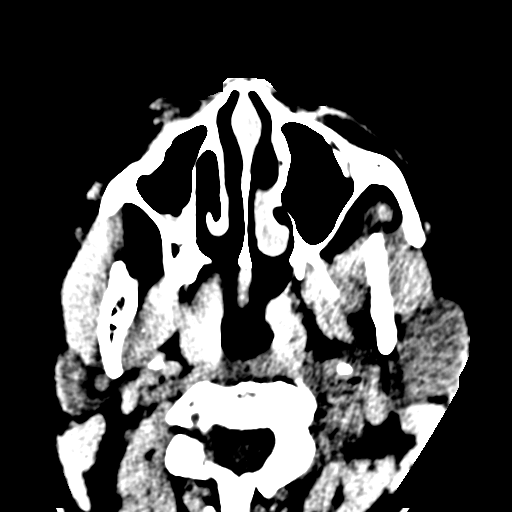
[im 69/87  brain]
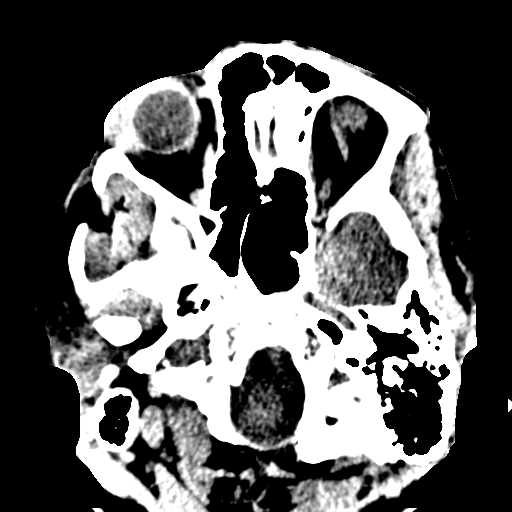

[Series 603: <mpr thick range(1)> · coronal · 0.35mm/px · 3 of 87 slices shown]
[im 18/87  brain]
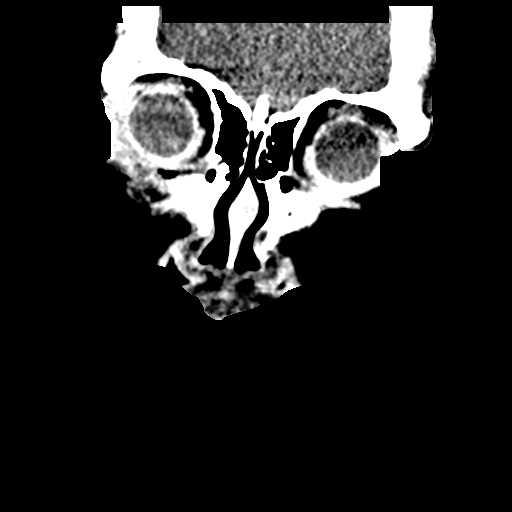
[im 35/87  brain]
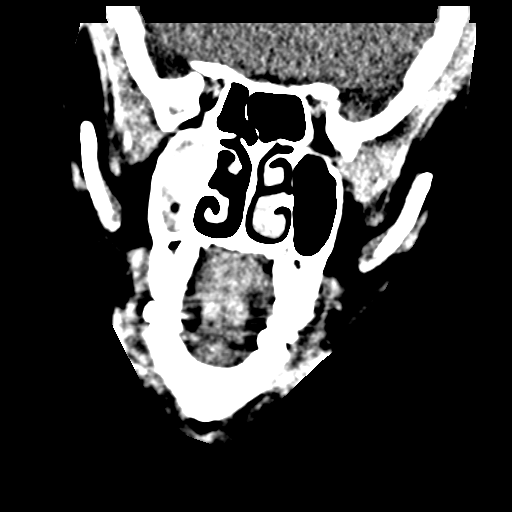
[im 52/87  brain]
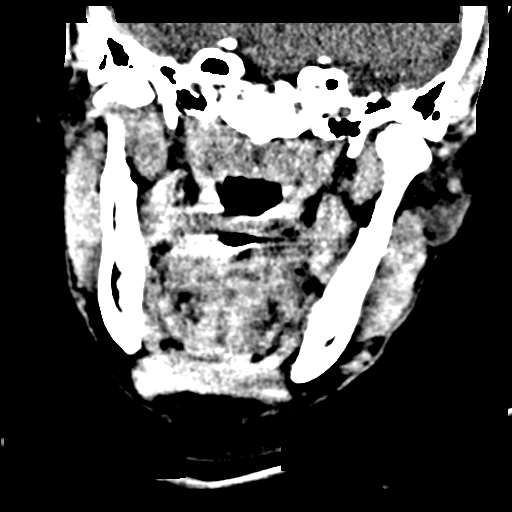

[15 of 40 positions shown; findings below may reference images not displayed]

FINDINGS: CT HEAD FINDINGS

No mass. No intra tiny hemorrhage. No hydrocephalus. Upper skull is
intact. Soft tissue calcification noted left posterior scalp.

CT MAXILLOFACIAL FINDINGS

Fracture of the inferior wall of the right orbit is present.
Fracture slightly displaced inferiorly. Fluid is noted in the right
maxillary sinus. Mild edema noted about the right globe. The globe
is intact. Intraorbital contents intact and in place. Medial and
lateral orbital walls are intact. Zygomas are intact. Left orbits
intact.

CT CERVICAL SPINE FINDINGS

Loss of normal cervical lordosis present and slight rotation. There
is no evidence of fracture or dislocation.
IMPRESSION: 1. Right inferior orbital wall fracture with slight inferior
displacement. Intraorbital contents are normal. Soft tissue swelling
about the right orbit.

2.  Head and maxillofacial CT unremarkable.

3. Cervical spine straightening and rotation, most likely
positional. No evidence fracture or dislocation.

## 2015-11-08 ENCOUNTER — Encounter (HOSPITAL_COMMUNITY): Payer: Self-pay

## 2016-07-16 IMAGING — DX DG KNEE COMPLETE 4+V*L*
1 series · 1 of 1 positions shown · non-contrast
Comparison: 04/30/2013

CLINICAL DATA: Fall. Left knee pain for 2 weeks. Initial encounter.

EXAM:
LEFT KNEE - COMPLETE 4+ VIEW

[knee ap]
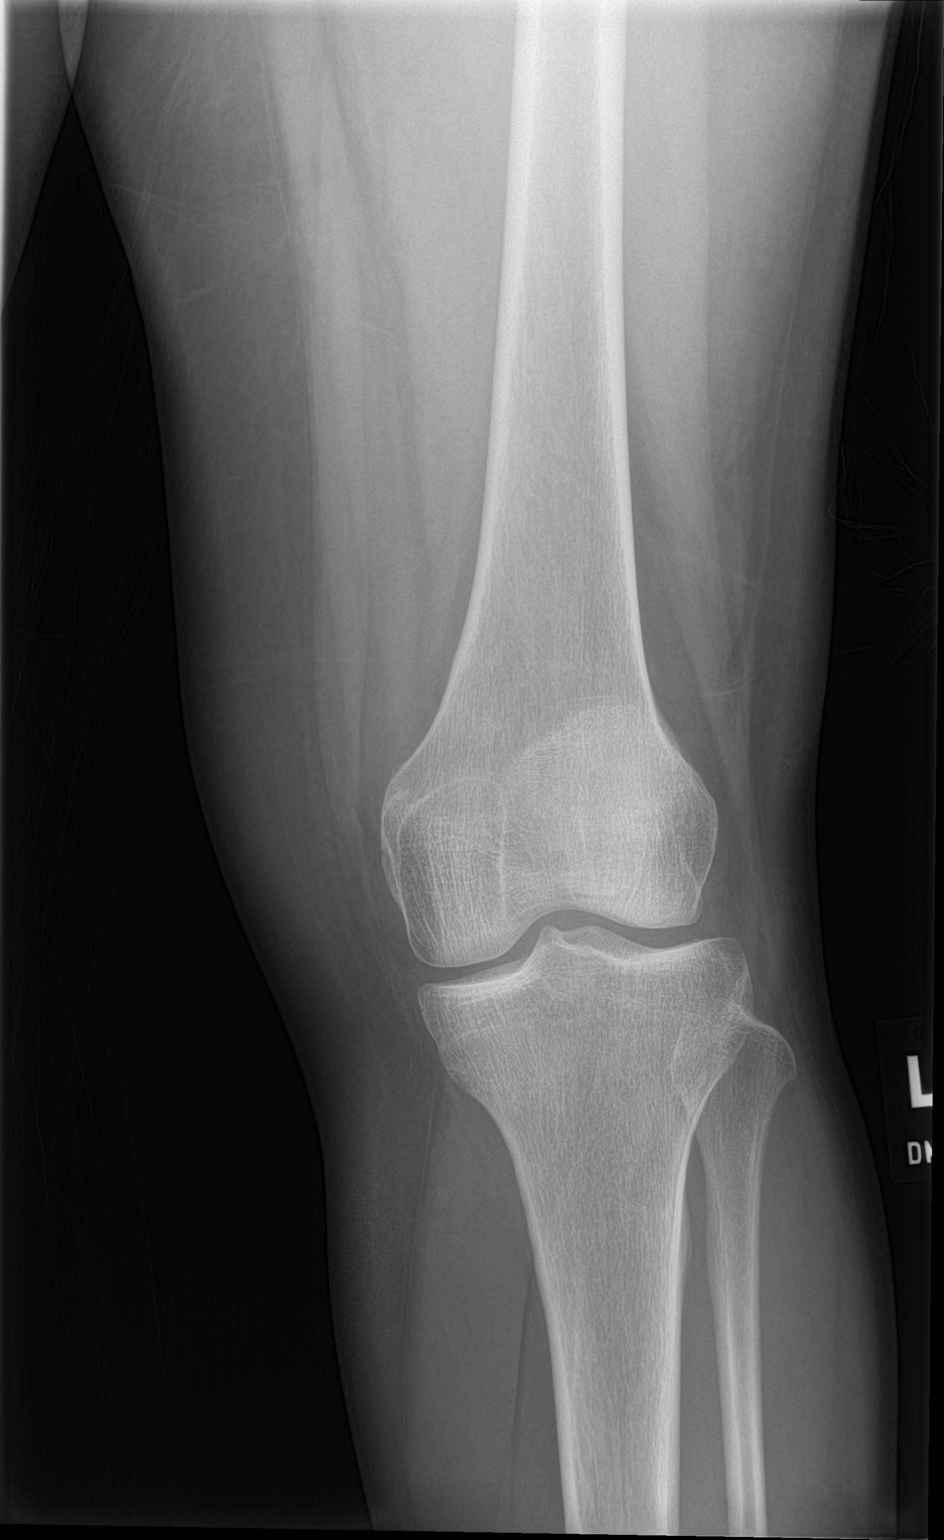

[1 of 1 positions shown; findings below may reference images not displayed]

FINDINGS: There is a new, small to moderate size suprapatellar joint effusion.
No acute fracture or dislocation is identified. No lytic or blastic
osseous lesion or soft tissue abnormality is seen. Joint space
widths are preserved.
IMPRESSION: New knee joint effusion.  No acute osseous abnormality identified.

## 2016-12-22 IMAGING — US US OB COMP LESS 14 WK
1 series · 15 of 28 positions shown · non-contrast
Comparison: None.

CLINICAL DATA: Initial encounter for pain 1 week history of
intermittent sharp pelvic pain with positive pregnancy test.

EXAM:
OBSTETRIC <14 WK US AND TRANSVAGINAL OB US
TECHNIQUE: Both transabdominal and transvaginal ultrasound examinations were
performed for complete evaluation of the gestation as well as the
maternal uterus, adnexal regions, and pelvic cul-de-sac.
Transvaginal technique was performed to assess early pregnancy.

[Series 1: us ob comp less 14 wk · 15 of 61 slices shown]
[im 1/61]
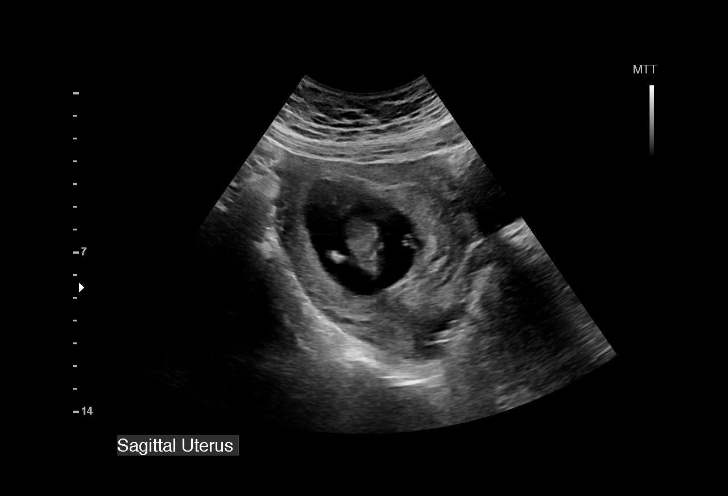
[im 5/61]
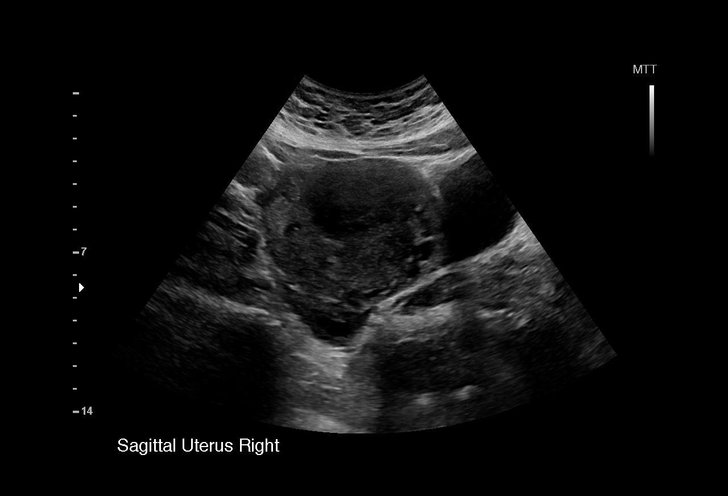
[im 9/61]
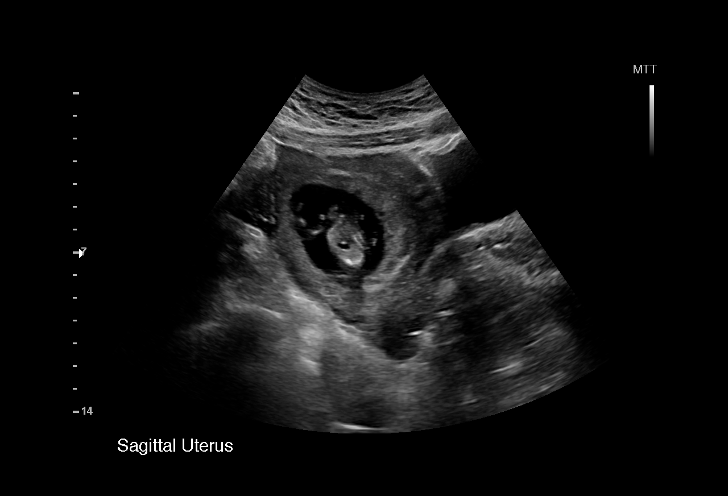
[im 14/61]
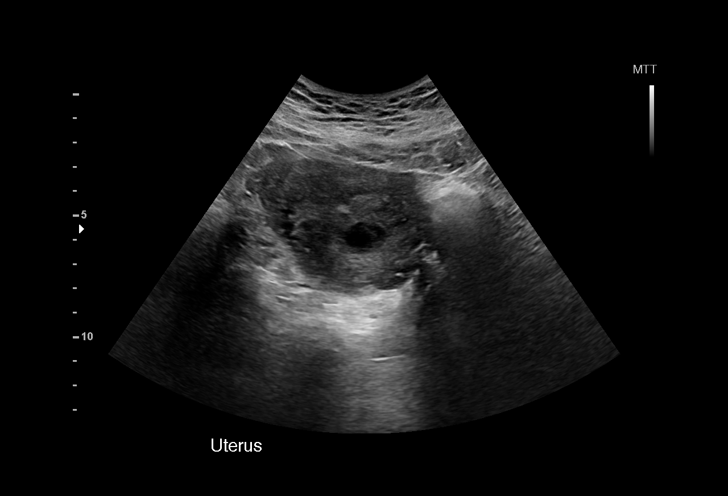
[im 18/61]
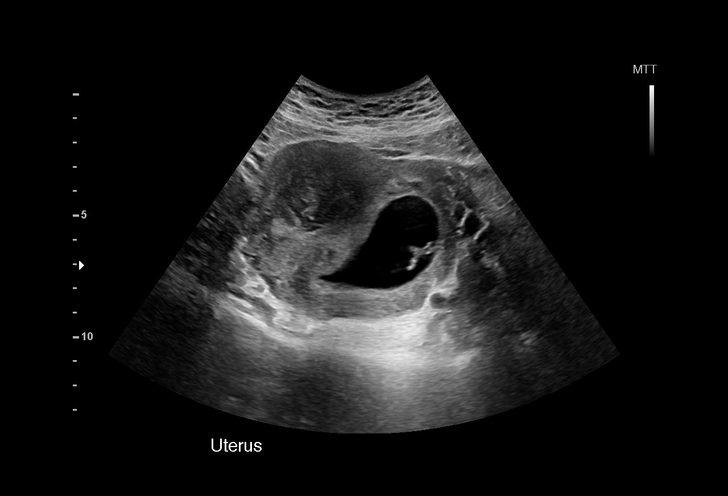
[im 23/61]
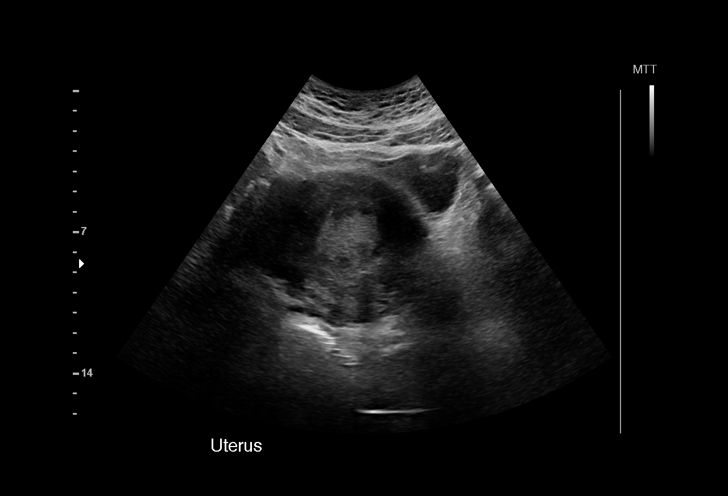
[im 27/61]
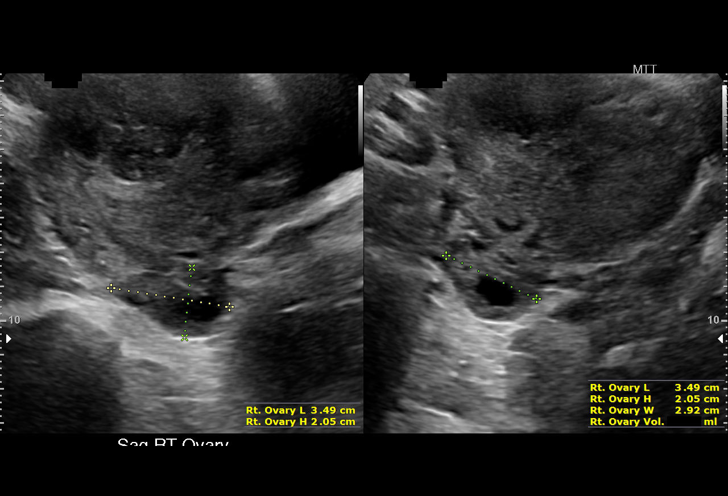
[im 32/61]
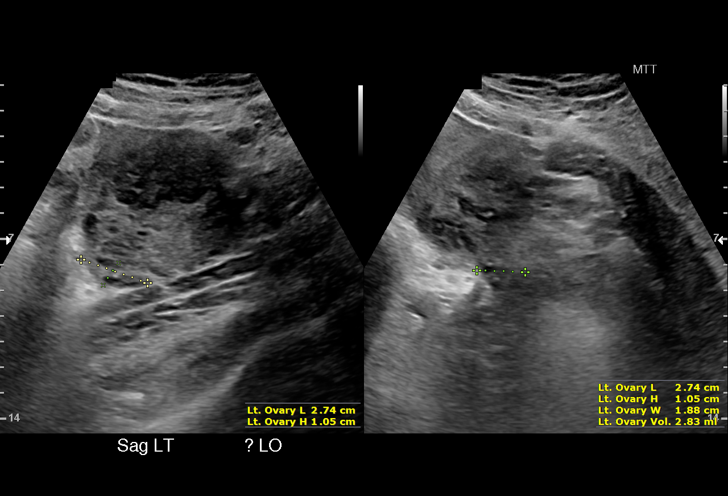
[im 34/61]
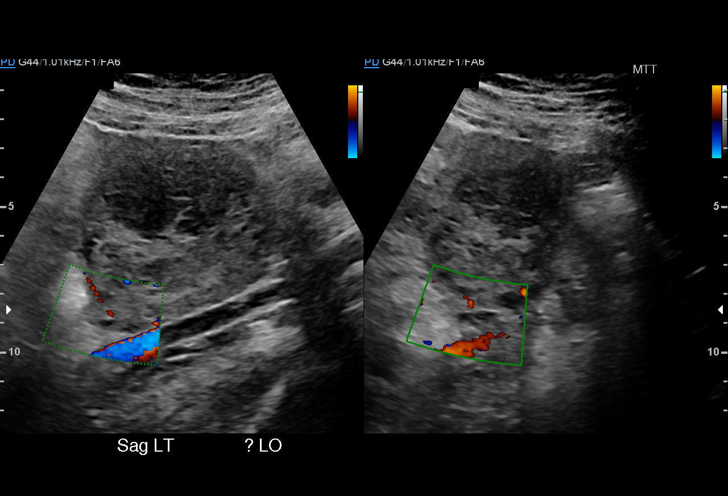
[im 38/61]
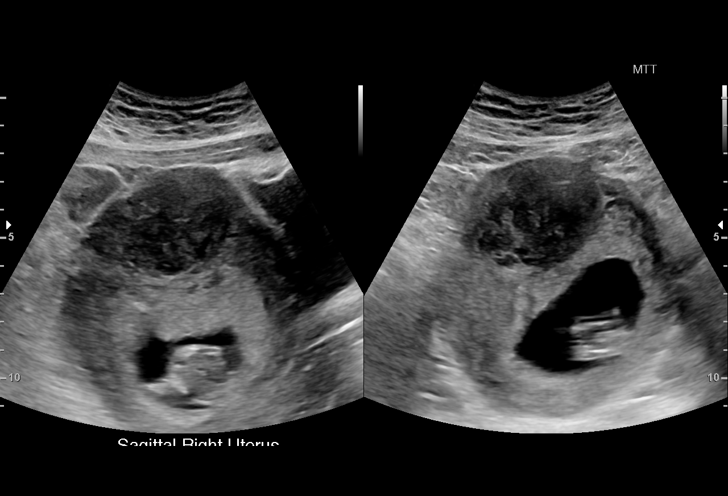
[im 43/61]
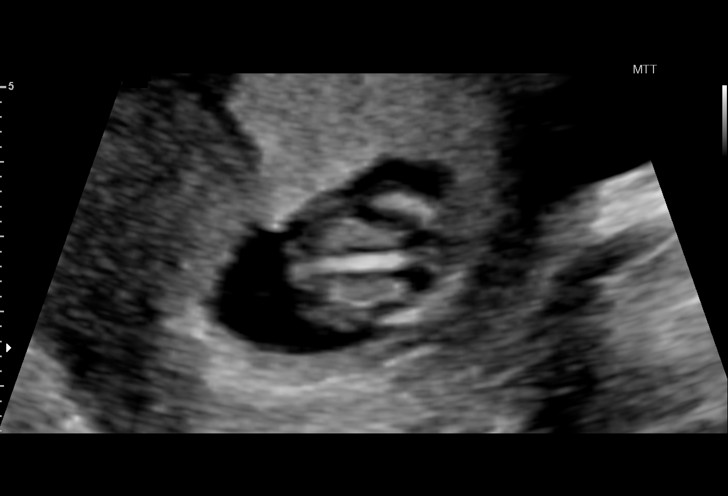
[im 47/61]
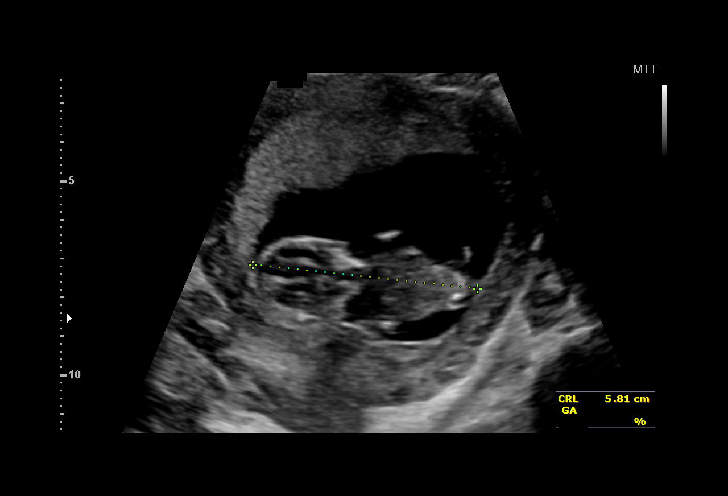
[im 52/61]
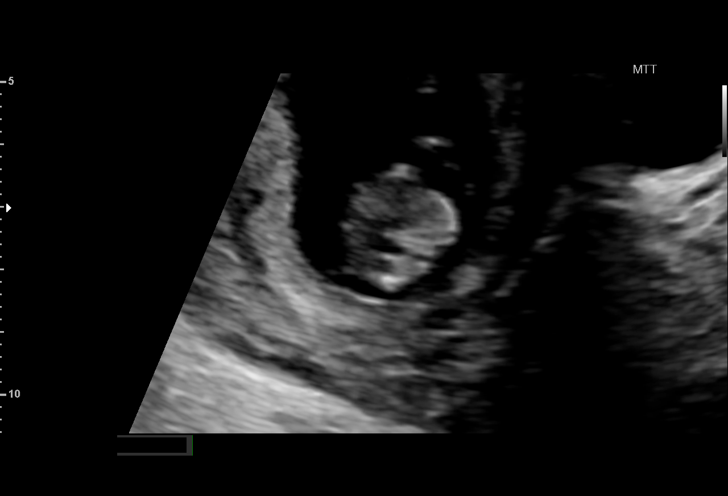
[im 56/61]
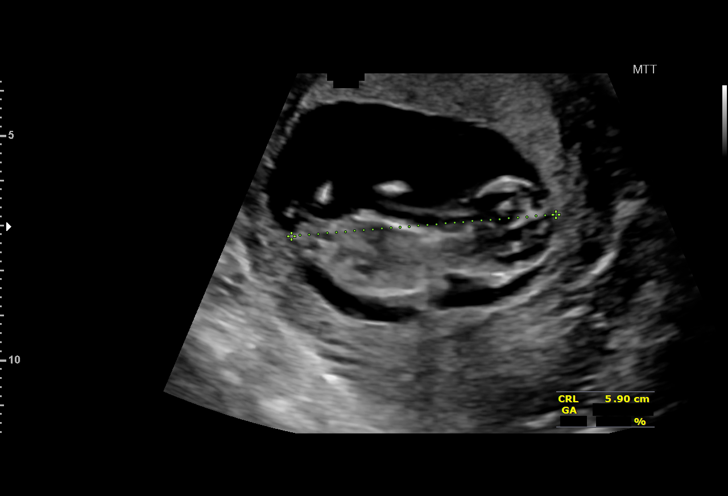
[im 61/61]
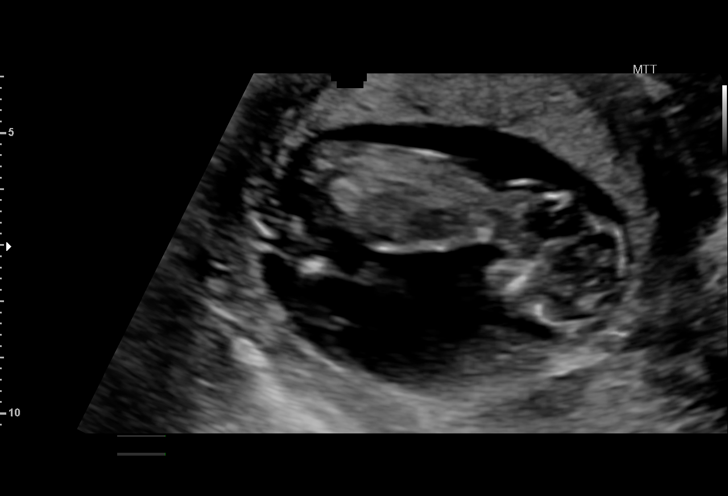

[15 of 28 positions shown; findings below may reference images not displayed]

FINDINGS: Intrauterine gestational sac: Visualized/normal in shape.

Yolk sac:  Not visualized

Embryo:  Visualized

Cardiac Activity: Visualized

Heart Rate: 150  bpm

CRL:  60.5  mm   12 w   4 d                  US EDC: 07/14/2015.

Maternal uterus/adnexae: No evidence for adnexal mass. Corpus luteum
cyst identified in the right ovary. Possible right uterine fibroid.
IMPRESSION: Single living intrauterine gestation at estimated 12 week 4 day
gestational age by crown-rump length.
